# Patient Record
Sex: Male | Born: 1950 | Race: White | Hispanic: No | Marital: Married | Smoking: Never smoker
Health system: Southern US, Community
[De-identification: ages and names within clinical notes are randomized; demographics above are authoritative.]

## PROBLEM LIST (undated history)

## (undated) DIAGNOSIS — G4730 Sleep apnea, unspecified: Secondary | ICD-10-CM

## (undated) DIAGNOSIS — Z8669 Personal history of other diseases of the nervous system and sense organs: Secondary | ICD-10-CM

## (undated) DIAGNOSIS — C679 Malignant neoplasm of bladder, unspecified: Secondary | ICD-10-CM

## (undated) DIAGNOSIS — I2510 Atherosclerotic heart disease of native coronary artery without angina pectoris: Secondary | ICD-10-CM

## (undated) DIAGNOSIS — Z8679 Personal history of other diseases of the circulatory system: Secondary | ICD-10-CM

## (undated) HISTORY — PX: TONSILLECTOMY: SUR1361

## (undated) HISTORY — PX: CARDIAC SURGERY: SHX584

---

## 2012-02-14 DIAGNOSIS — I359 Nonrheumatic aortic valve disorder, unspecified: Secondary | ICD-10-CM | POA: Insufficient documentation

## 2012-11-05 DIAGNOSIS — I480 Paroxysmal atrial fibrillation: Secondary | ICD-10-CM | POA: Insufficient documentation

## 2012-11-05 DIAGNOSIS — Z953 Presence of xenogenic heart valve: Secondary | ICD-10-CM | POA: Insufficient documentation

## 2012-11-06 DIAGNOSIS — I214 Non-ST elevation (NSTEMI) myocardial infarction: Secondary | ICD-10-CM | POA: Insufficient documentation

## 2013-01-16 DIAGNOSIS — I2510 Atherosclerotic heart disease of native coronary artery without angina pectoris: Secondary | ICD-10-CM | POA: Insufficient documentation

## 2013-02-12 DIAGNOSIS — I252 Old myocardial infarction: Secondary | ICD-10-CM | POA: Insufficient documentation

## 2013-02-13 DIAGNOSIS — Z8042 Family history of malignant neoplasm of prostate: Secondary | ICD-10-CM | POA: Insufficient documentation

## 2015-07-07 DIAGNOSIS — K219 Gastro-esophageal reflux disease without esophagitis: Secondary | ICD-10-CM | POA: Insufficient documentation

## 2015-07-08 DIAGNOSIS — I38 Endocarditis, valve unspecified: Secondary | ICD-10-CM | POA: Insufficient documentation

## 2015-07-15 DIAGNOSIS — I340 Nonrheumatic mitral (valve) insufficiency: Secondary | ICD-10-CM | POA: Insufficient documentation

## 2016-04-18 DIAGNOSIS — Z9889 Other specified postprocedural states: Secondary | ICD-10-CM | POA: Insufficient documentation

## 2018-11-20 DIAGNOSIS — Z95818 Presence of other cardiac implants and grafts: Secondary | ICD-10-CM | POA: Insufficient documentation

## 2019-01-16 ENCOUNTER — Ambulatory Visit (INDEPENDENT_AMBULATORY_CARE_PROVIDER_SITE_OTHER): Payer: Medicare Other | Admitting: Sports Medicine

## 2019-01-16 ENCOUNTER — Other Ambulatory Visit: Payer: Self-pay

## 2019-01-16 VITALS — BP 136/72 | Ht 68.0 in | Wt 172.0 lb

## 2019-01-16 DIAGNOSIS — M7671 Peroneal tendinitis, right leg: Secondary | ICD-10-CM

## 2019-01-16 DIAGNOSIS — M25572 Pain in left ankle and joints of left foot: Secondary | ICD-10-CM

## 2019-01-16 DIAGNOSIS — Q667 Congenital pes cavus, unspecified foot: Secondary | ICD-10-CM | POA: Diagnosis not present

## 2019-01-16 NOTE — Assessment & Plan Note (Addendum)
Caused by pes cavus bilaterally causing varus stress with supination of foot, 5th MTP joint floating off ground. Provided sports insole with 5th ray post.  Plan: - sports insole w/ 5th ray post - topical Voltaren PRN - icing daily - return in 1 month for cushioned orthotic

## 2019-01-16 NOTE — Patient Instructions (Addendum)
Icing 15-20 minutes 2-3 times daily  Over-the-counter Voltaren Gel. Use this 4 times daily  Ankle rehab exercises with the theraband  Follow-up one month and we will make you custom orthotics

## 2019-01-16 NOTE — Progress Notes (Signed)
  William Cook - 68 y.o. 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 Reports that it started 9 months ago- switched tennis shoes and started playing on hard courts more rather than clay courts. 5 months ago, he saw sports medicine physician in [place], diagnosed him with peroneal tendonitis, gave him a 2 week course of prednisone and rest. Symptoms improved, but when he started playing tennis more (3-4 x a week), pain returned. It becomes painful enough to the point of limping. Has been icing feet regularly, using ibuprofen. Wears a sleeve most of the time and in addition wears an ankle brace to play tennis. Has rested the past 3 weeks due to pain and generally pain has subsided.   ROS: No unexpected weight loss, fever, chills, swelling, instability, muscle pain, numbness/tingling, redness, otherwise see HPI   PMHx - Updated and reviewed.  Contributory factors include: Negative PSHx - Updated and reviewed.  Contributory factors include:  Negative FHx - Updated and reviewed.  Contributory factors include:  Negative Social Hx - Updated and reviewed. Contributory factors include: Negative Medications - reviewed   DATA REVIEWED: none  PHYSICAL EXAM:  VS: BP:136/72  HR: bpm  TEMP: ( )  RESP:   HT:5\' 8"  (172.7 cm)   WT:172 lb (78 kg)  BMI:26.16 PHYSICAL EXAM: Gen: NAD, alert, cooperative with exam, well-appearing HEENT: clear conjunctiva,  CV:  no edema, capillary refill brisk, normal rate Resp: non-labored Skin: no rashes, normal turgor  Neuro: no gross deficits.  Psych:  alert and oriented  Left Foot: Inspection:  No obvious bony deformity.  No swelling, erythema, or bruising.  Pes cavus bilaterally. 5th toe subluxed with floating 5th MTP. Feet supinate while standing. Palpation: TTP over peroneal tubercle. ROM: Full  ROM of the ankle. Normal midfoot flexibility Strength: 5/5 strength ankle in all  planes Neurovascular: N/V intact distally in the lower extremity Special tests: Negative anterior drawer. Negative squeeze. Normal calcaneal motion with heel raise  Right foot: - pes cavus, 5th toe subluxed, foot supinates while standing  Limited ultrasound, [place] left ankle:  - Peroneus longus and brevis: Evaluated in longitudinal and transverse planes, followed to attachment at base of 5th metatarsal. Evidence of tenosynovitis worse at peroneal tubercle with significant hypoechoic changes.   Impression: peroneal tendinopathy, worse at peroneal tubercle  Ultrasound and interpretation by Dr. Mayer Masker and Wolfgang Phoenix. Fields, MD    ASSESSMENT & PLAN:   Right peroneal tendinopathy Caused by pes cavus bilaterally causing varus stress with supination of foot, 5th MTP joint floating off ground. Provided sports insole with 5th ray post.  Plan: - sports insole w/ 5th ray post - topical Voltaren PRN - icing daily - return in 1 month for cushioned orthotic  I observed and examined the patient Dr. Mayer Masker and agree with assessment and plan.  Note reviewed and modified by me. Ila Mcgill, MD

## 2019-02-13 ENCOUNTER — Ambulatory Visit (INDEPENDENT_AMBULATORY_CARE_PROVIDER_SITE_OTHER): Payer: Medicare Other | Admitting: Sports Medicine

## 2019-02-13 ENCOUNTER — Other Ambulatory Visit: Payer: Self-pay

## 2019-02-13 VITALS — BP 116/80 | Ht 68.0 in | Wt 172.0 lb

## 2019-02-13 DIAGNOSIS — M7671 Peroneal tendinitis, right leg: Secondary | ICD-10-CM | POA: Diagnosis not present

## 2019-02-13 DIAGNOSIS — Q667 Congenital pes cavus, unspecified foot: Secondary | ICD-10-CM

## 2019-02-13 NOTE — Progress Notes (Signed)
  William Cook - 68 y.o. male MRN [id number]  Date of birth: 24-Apr-1951  SUBJECTIVE:   CC: orthotics   William Cook is a 68 yo male returning to clinic today for follow up of right peroneal tendinopathy and to have orthotics made. He reports that his pain has improved significantly since using sports insole with 5th ray post and doing ankle strengthening exercises. He would prefer to continue with sports insole instead of having custom orthotics made as he has been very pleased with the comfort and fit of current insole.  ROS: No unexpected weight loss, fever, chills, swelling, instability, muscle pain, numbness/tingling, redness, otherwise see HPI   PMHx - Updated and reviewed.  Contributory factors include: Negative PSHx - Updated and reviewed.  Contributory factors include:  Negative FHx - Updated and reviewed.  Contributory factors include:  Negative Social Hx - Updated and reviewed. Contributory factors include: Negative Medications - reviewed   DATA REVIEWED: Prior records  PHYSICAL EXAM:  VS: BP:116/80  HR: bpm  TEMP: ( )  RESP:   HT:5\' 8"  (172.7 cm)   WT:172 lb (78 kg)  BMI:26.16 PHYSICAL EXAM: Gen: NAD, alert, cooperative with exam, well-appearing HEENT: clear conjunctiva,  CV:  no edema, capillary refill brisk, normal rate Resp: non-labored Skin: no rashes, normal turgor  Neuro: no gross deficits.  Psych:  alert and oriented  Left foot:  Inspection: pes cavus, 5th toe subluxes Palpation: no TTP ROM: full ROM of ankle Strength: 5/5 strength of ankle in all planes NV intact   ASSESSMENT & PLAN:   Right peroneal tendinopathy- significant improvement with sports insole with lateral post and ankle strengthening exercises. He would prefer to have second green sports insole instead of getting custom orthotics today. Provided second sports insole with lateral post and showed him how to apply the post to the insole. Provided sheet of 1/8th poron blue material and  information for insole so he can purchase these and make them for himself in the future. He knows that he can call and return for custom orthotics if he desires and will return as needed.   Total time spent with the patient was 15 minutes with greater than 50% of the time spent in face-to-face consultation discussing sports insole construction, instruction, and sitting.  Patient seen and evaluated with the sports medicine fellow.  I agree with the above plan of care.  Patient is finding his green sports insoles very comfortable.  He would like to continue with these for the time being.  He understands that we can make him custom orthotics at a later date if he prefers.  Follow-up as needed.

## 2019-03-06 ENCOUNTER — Encounter: Payer: Medicare Other | Admitting: Sports Medicine

## 2019-07-03 DIAGNOSIS — R0602 Shortness of breath: Secondary | ICD-10-CM | POA: Insufficient documentation

## 2019-07-03 DIAGNOSIS — R0981 Nasal congestion: Secondary | ICD-10-CM | POA: Insufficient documentation

## 2019-07-03 DIAGNOSIS — G479 Sleep disorder, unspecified: Secondary | ICD-10-CM | POA: Insufficient documentation

## 2019-07-15 ENCOUNTER — Ambulatory Visit: Payer: Medicare Other | Attending: Neurology

## 2019-07-15 DIAGNOSIS — R0602 Shortness of breath: Secondary | ICD-10-CM | POA: Diagnosis not present

## 2019-07-15 DIAGNOSIS — G4761 Periodic limb movement disorder: Secondary | ICD-10-CM | POA: Insufficient documentation

## 2019-07-15 DIAGNOSIS — G4733 Obstructive sleep apnea (adult) (pediatric): Secondary | ICD-10-CM | POA: Diagnosis not present

## 2019-07-16 ENCOUNTER — Other Ambulatory Visit: Payer: Self-pay

## 2019-08-19 DIAGNOSIS — G4733 Obstructive sleep apnea (adult) (pediatric): Secondary | ICD-10-CM | POA: Insufficient documentation

## 2019-08-21 ENCOUNTER — Ambulatory Visit (INDEPENDENT_AMBULATORY_CARE_PROVIDER_SITE_OTHER): Payer: Medicare Other | Admitting: Pediatrics

## 2019-08-21 ENCOUNTER — Other Ambulatory Visit: Payer: Self-pay

## 2019-08-21 VITALS — BP 120/70 | Ht 68.0 in | Wt 170.0 lb

## 2019-08-21 DIAGNOSIS — M899 Disorder of bone, unspecified: Secondary | ICD-10-CM | POA: Diagnosis present

## 2019-08-21 NOTE — Progress Notes (Signed)
  Ranbir Yohey - 69 y.o. male MRN [id number]  Date of birth: [birth year]  SUBJECTIVE:   CC: left foot pain   Eraclio Lord is a 68 yo male returning to clinic today for pain in left forefoot for the past week. He is an avid Firefighter and has been seen before for peroneal tendonitis, which is improving. He reports that 5 days ago, he was played tennis on hard court for 2 hours. He reports that started to have pain on the ball of his first at his first toe (points to 1st MTP). The pain continued the next few days but with rest it now is better. Pain was worsened when he bent his toe back toward him.   He wears ankle sleeves with tennis and has a green hapad insert with a 5th ray post. He thinks that these have worked well for him. He is reqresting poron material as he cannot find it online.   ROS: No unexpected swelling, instability, muscle pain, numbness/tingling, redness, otherwise see HPI   PMHx - Updated and reviewed.  Contributory factors include: Negative PSHx - Updated and reviewed.  Contributory factors include:  Negative FHx - Updated and reviewed.  Contributory factors include:  Negative Social Hx - Updated and reviewed. Contributory factors include: Negative Medications - reviewed   DATA REVIEWED: Prior records  PHYSICAL EXAM:  VS: BP:120/70  HR: bpm  TEMP: ( )  RESP:   HT:5\' 8"  (172.7 cm)   WT:170 lb (77.1 kg)  BMI:25.85 PHYSICAL EXAM: Gen: NAD, alert, cooperative with exam, well-appearing HEENT: clear conjunctiva,  CV:  no edema, capillary refill brisk, normal rate Resp: non-labored Skin: no rashes, normal turgor  Neuro: no gross deficits.  Psych:  alert and oriented  Left foot:  Inspection: pes cavus, 5th toe subluxes. Palpation: no TTP (prior TTP at 1st MT head) ROM: full ROM of ankle Strength: 5/5 strength of ankle in all planes NV intact   ASSESSMENT & PLAN:  Pain was likely pain over sesamoid bone from impact on hard tennis courts where he is often on  the balls of his feet. Provided dancer's pad to see if this helps and will prevent future pain. Also provided poron material so that he can continue to make 5th ray post on his new green sports inserts. Discussed with him that he is allowed to play but should be aware of pain and avoid pain-eliciting activities as repeated aggravation of sesamoids could lead to sesamoiditis. Return PRN.  I was the preceptor for this visit and available for immediate consultation Shellia Cleverly, DO

## 2019-08-25 ENCOUNTER — Other Ambulatory Visit
Admission: RE | Admit: 2019-08-25 | Discharge: 2019-08-25 | Disposition: A | Discharge status: Home / Self Care | Payer: Medicare Other | Source: Ambulatory Visit | Attending: Acute Care | Admitting: Acute Care

## 2019-08-25 ENCOUNTER — Other Ambulatory Visit: Payer: Self-pay

## 2019-08-25 DIAGNOSIS — Z20822 Contact with and (suspected) exposure to covid-19: Secondary | ICD-10-CM | POA: Diagnosis not present

## 2019-08-25 DIAGNOSIS — Z01812 Encounter for preprocedural laboratory examination: Secondary | ICD-10-CM | POA: Diagnosis present

## 2019-08-26 LAB — SARS CORONAVIRUS 2 (TAT 6-24 HRS): SARS Coronavirus 2: NEGATIVE

## 2019-08-27 ENCOUNTER — Ambulatory Visit: Payer: Medicare Other | Attending: Neurology

## 2019-08-27 DIAGNOSIS — G4761 Periodic limb movement disorder: Secondary | ICD-10-CM | POA: Insufficient documentation

## 2019-08-27 DIAGNOSIS — G4733 Obstructive sleep apnea (adult) (pediatric): Secondary | ICD-10-CM | POA: Diagnosis not present

## 2019-08-28 ENCOUNTER — Other Ambulatory Visit: Payer: Self-pay

## 2019-09-09 DIAGNOSIS — Z4509 Encounter for adjustment and management of other cardiac device: Secondary | ICD-10-CM | POA: Insufficient documentation

## 2019-09-09 DIAGNOSIS — R001 Bradycardia, unspecified: Secondary | ICD-10-CM | POA: Insufficient documentation

## 2019-09-09 DIAGNOSIS — I493 Ventricular premature depolarization: Secondary | ICD-10-CM | POA: Insufficient documentation

## 2020-04-05 ENCOUNTER — Other Ambulatory Visit: Payer: Medicare Other

## 2020-05-30 DIAGNOSIS — R310 Gross hematuria: Secondary | ICD-10-CM | POA: Insufficient documentation

## 2020-06-07 ENCOUNTER — Ambulatory Visit (INDEPENDENT_AMBULATORY_CARE_PROVIDER_SITE_OTHER): Payer: Medicare Other | Admitting: Family Medicine

## 2020-06-07 ENCOUNTER — Other Ambulatory Visit: Payer: Self-pay

## 2020-06-07 ENCOUNTER — Encounter: Payer: Self-pay | Admitting: Family Medicine

## 2020-06-07 VITALS — BP 154/94 | Ht 68.0 in | Wt 180.0 lb

## 2020-06-07 DIAGNOSIS — M5442 Lumbago with sciatica, left side: Secondary | ICD-10-CM

## 2020-06-07 DIAGNOSIS — R269 Unspecified abnormalities of gait and mobility: Secondary | ICD-10-CM | POA: Insufficient documentation

## 2020-06-07 DIAGNOSIS — Q667 Congenital pes cavus, unspecified foot: Secondary | ICD-10-CM | POA: Diagnosis not present

## 2020-06-07 NOTE — Progress Notes (Signed)
PCP: Kayleen Memos, MD  Subjective:   HPI: Patient is a 69 y.o. male here for custom orthotics.  Patient returns for custom orthotics. Sports insoles have helped him tremendously. No pain in feet. Current issue is pain in left leg from low back down posterior leg. Worse after playing tennis. No pain over sesamoids now either.  History reviewed. No pertinent past medical history.  Current Outpatient Medications on File Prior to Visit  Medication Sig Dispense Refill  . acyclovir cream (ZOVIRAX) 5 % Apply topically.    Marland Kitchen amLODipine (NORVASC) 5 MG tablet Take 5 mg by mouth daily.    Marland Kitchen aspirin EC 81 MG tablet Take by mouth.    Marland Kitchen atorvastatin (LIPITOR) 10 MG tablet TAKE 1 TABLET BY MOUTH EVERYDAY AT BEDTIME    . ELIQUIS 5 MG TABS tablet Take 5 mg by mouth 2 (two) times daily.    Marland Kitchen esomeprazole (NEXIUM) 10 MG packet Take 10 mg by mouth daily before breakfast.    . famotidine (PEPCID) 20 MG tablet Take by mouth.    . fluticasone (FLONASE) 50 MCG/ACT nasal spray Place into the nose.    . Multiple Vitamin (MULTIVITAMIN) tablet Take by mouth.    . nitroGLYCERIN (NITROSTAT) 0.4 MG SL tablet Place under the tongue.     No current facility-administered medications on file prior to visit.    History reviewed. No pertinent surgical history.  No Known Allergies  Social History   Socioeconomic History  . Marital status: Married    Spouse name: Not on file  . Number of children: Not on file  . Years of education: Not on file  . Highest education level: Not on file  Occupational History  . Not on file  Tobacco Use  . Smoking status: Not on file  . Smokeless tobacco: Not on file  Substance and Sexual Activity  . Alcohol use: Not on file  . Drug use: Not on file  . Sexual activity: Not on file  Other Topics Concern  . Not on file  Social History Narrative  . Not on file   Social Determinants of Health   Financial Resource Strain: Not on file  Food Insecurity: Not on file   Transportation Needs: Not on file  Physical Activity: Not on file  Stress: Not on file  Social Connections: Not on file  Intimate Partner Violence: Not on file    History reviewed. No pertinent family history.  BP (!) 154/94   Ht 5\' 8"  (1.727 m)   Wt 180 lb (81.6 kg)   BMI 27.37 kg/m   [facility] 06/07/2020  Frequency of aerobic exercise (# of days/week) 5  Average time in minutes 60  Frequency of strengthening activities (# of days/week) 3    No flowsheet data found.  Review of Systems: See HPI above.     Objective:  Physical Exam:  Gen: NAD, comfortable in exam room  Bilateral feet/ankles: Leg lengths equal. Pes cavus.  Transverse arch collapse bilaterally without callus or splaying.  Bunionettes bilaterally.  Mild hallux rigidus bilaterally. No other gross deformity, swelling, ecchymoses FROM with normal strength. No TTP. negative ant drawer and negative talar tilt.   Thompsons test negative. NV intact distally.  Gait: supination noted bilaterally, excellent control with custom orthotics.  Back: No gross deformity, scoliosis. No paraspinal TTP.  No midline or bony TTP. FROM but decreased hamstring and SI joint flexibility. Strength LEs 5/5 all muscle groups.   Negative SLRs. Sensation intact  to light touch bilaterally.  Assessment & Plan:  1. Bilateral foot pain, gait abnormality - custom orthotics made today and comfortable to patient.  Reviewed home exercises for low back, radiculopathy.  Patient was fitted for a : standard, cushioned, semi-rigid orthotic. The orthotic was heated and afterward the patient stood on the orthotic blank positioned on the orthotic stand. The patient was positioned in subtalar neutral position and 10 degrees of ankle dorsiflexion in a weight bearing stance. After completion of molding, a stable base was applied to the orthotic blank. The blank was ground to a stable position for weight  bearing. Size: 10  Base: blue med density eva Posting: lateral posting bilaterally Additional orthotic padding: none

## 2020-06-26 HISTORY — PX: BLADDER SURGERY: SHX569

## 2020-07-02 DIAGNOSIS — Z8679 Personal history of other diseases of the circulatory system: Secondary | ICD-10-CM | POA: Insufficient documentation

## 2020-07-14 ENCOUNTER — Ambulatory Visit: Payer: Medicare Other | Admitting: Family Medicine

## 2020-07-15 ENCOUNTER — Ambulatory Visit (INDEPENDENT_AMBULATORY_CARE_PROVIDER_SITE_OTHER): Payer: Medicare Other | Admitting: Family Medicine

## 2020-07-15 ENCOUNTER — Other Ambulatory Visit: Payer: Self-pay

## 2020-07-15 VITALS — BP 142/84 | Ht 68.0 in | Wt 180.0 lb

## 2020-07-15 DIAGNOSIS — M5416 Radiculopathy, lumbar region: Secondary | ICD-10-CM | POA: Diagnosis not present

## 2020-07-15 NOTE — Progress Notes (Addendum)
   SUBJECTIVE:   CHIEF COMPLAINT / HPI:   William Cook is a 70 y.o. male here for lower extremity pain.  Left leg pain Patient reports left leg pain for the past 3-4 months.  Pain radiates to his foot and occasionally his toes.  Has chronic left-sided back pain as well.  Pain described as burning and shooting.  He plays tennis 3-4 times a week.  About 1 to 2 weeks ago he was playing tennis and the pain occurred during his match where as it normally occurs after his match. He has not played in the past week due to his pain in the weather.  No specific activity makes pain better or worse. Patient has history of right peroneal tendinopathy and states insoles have helped a lot.     PERTINENT  PMH / PSH: reviewed and updated as appropriate   OBJECTIVE:   BP (!) 142/84   Ht 5\' 8"  (1.727 m)   Wt 180 lb (81.6 kg)   BMI 27.37 kg/m    GEN: well appearing male in no acute distress  CVS: well perfused  RESP: speaking in full sentences without pause, no respiratory distress  MSK:  Ankle, knee and hip strength 5/5.  Negative straight leg raise. Mild tenderness over the piriformis muscle, ischial tuberosity.  No SI joint tenderness.  No midline T or L-spine tenderness.  Left paraspinal hypertonicity present.  ASSESSMENT/PLAN:   Patient is a 70 year old male with chronic low back and extremity pain  Lumbar radiculopathy Discussed treatment options with patient.  No imaging available for review.  Start physical therapy and avoid offending activities.  Follow-up, if not improving.   Lyndee Hensen, DO PGY-2, [facility] 07/15/2020    I was the preceptor for this visit and available for immediate consultation Shellia Cleverly, DO

## 2020-08-17 DIAGNOSIS — C679 Malignant neoplasm of bladder, unspecified: Secondary | ICD-10-CM | POA: Insufficient documentation

## 2020-08-26 ENCOUNTER — Other Ambulatory Visit: Payer: Self-pay

## 2020-08-26 ENCOUNTER — Ambulatory Visit (INDEPENDENT_AMBULATORY_CARE_PROVIDER_SITE_OTHER): Payer: Medicare Other | Admitting: Family Medicine

## 2020-08-26 VITALS — BP 142/82 | Ht 68.0 in | Wt 175.0 lb

## 2020-08-26 DIAGNOSIS — M5416 Radiculopathy, lumbar region: Secondary | ICD-10-CM

## 2020-08-26 NOTE — Progress Notes (Signed)
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  At her last appointment, patient was given home exercises as well as a formal physical therapy referral.  He states that he started to feel significantly better with doing the exercises and after meeting with PT. unfortunately, patient then had an unrelated medical setback requiring surgical intervention.  He is approximately 1 week out from his surgery, and says that his postop phase has prevented him from doing the previously prescribed rehabilitation exercises.  Since stopping these exercises, his pain has unfortunately returned.  He also thinks that this could be due to the fact that he has been doing a lot of math teaching over Zoom, which requires several long hours daily of sitting hunched in front of a computer monitor.  He confesses that he has poor posture with this prolonged sitting, and his back pain is most significantly notable upon initial standing after teaching.  Pain continues to radiate all the way into his left foot, which will occasionally have numbness during his more severe bouts.  No bowel or bladder dysfunction.  He is optimistic that when he is cleared by his surgeon he can return to his exercises and hopefully resume the progress that he had been making. Currently, he says today has been a very good day for his back and he has no active symptoms.              ROS:   All other systems were reviewed and are negative.  Objective: Vital Signs: BP (!) 142/82   Ht 5\' 8"  (1.727 m)   Wt 175 lb (79.4 kg)   BMI 26.61 kg/m   Physical Exam:  General:  Alert and oriented, in no acute  distress. Pulm:  Breathing unlabored. Psy:  Normal mood, congruent affect. Skin: Lower back with no bruising, rashes, or erythema. BACK EXAMINATION: Normal Gait.  Normal Spinal curvature, without excessive lumbar lordosis, thoracic kyphosis, or scoliosis.  Palpation: No midline tenderness, no deformity or step-offs. No paraspinal muscle tenderness, and no tenderness over the SI Joints bilaterally. Gluteus musculature and piriformis without tender points. Negative Sacral Spring's test.  Strength: Hip flexion (L1), Hip Aduction (L2), Knee Extension (L3) are 5/5 Bilaterally Foot Inversion (L4), Dorsiflexion (L5), and Eversion (S1) 5/5 Bilaterally Sensation: Intact to light touch medial and lateral aspects of lower extremities, and lateral, dorsal, and medial aspects of foot.  Special Tests:  FABER negative SLR: No radiation down Ipilateral or contralateral leg bilaterally   Imaging: No results found.  Assessment & Plan: 70 year old male presenting to clinic with concerns of left-sided lumbar radiculopathy.  Patient had been making good strides in improvement with exercise therapy, however he had to stop this due to a medical emergency. -At this time, patient says he is optimistic about returning to exercises for ongoing improvement. -Discussed the possibility of adding gabapentin at night during the postoperative phase, but patient declines at this time.  He says he would like to try to adjust the ergonomics of his sitting and ease back into the exercises before adding oral medications. -Discussed that our neck steps in  care should he not notice any benefit with exercises or possible eventual addition of gabapentin, would be MRI and consideration of spinal injections. -Patient is agreeable with plan.  We discussed sitting ergonomics at length, including taking standing breaks or transitioning to a standing desk.  He is optimistic about these changes. -Patient no further questions or concerns at  the completion of today's visit.  He will return to clinic in approximately 4 to 6 weeks for reevaluation.     Procedures: No procedures performed      I was the preceptor for this visit and available for immediate consultation Shellia Cleverly, DO

## 2020-09-01 ENCOUNTER — Encounter: Payer: Self-pay | Admitting: Urology

## 2020-09-01 ENCOUNTER — Ambulatory Visit (INDEPENDENT_AMBULATORY_CARE_PROVIDER_SITE_OTHER): Payer: Medicare Other | Admitting: Urology

## 2020-09-01 ENCOUNTER — Other Ambulatory Visit: Payer: Self-pay

## 2020-09-01 VITALS — BP 122/74 | HR 58 | Ht 68.0 in | Wt 175.0 lb

## 2020-09-01 DIAGNOSIS — R319 Hematuria, unspecified: Secondary | ICD-10-CM | POA: Diagnosis not present

## 2020-09-01 DIAGNOSIS — C672 Malignant neoplasm of lateral wall of bladder: Secondary | ICD-10-CM

## 2020-09-01 NOTE — Progress Notes (Signed)
   09/01/20 12:29 PM   William Cook 1951/02/13 [id number]  CC: Bladder cancer  HPI: I saw William Cook for second opinion on bladder cancer.  He is a 70 year old male with porcine heart valve and history of atrial fibrillation who remains on Eliquis who was recently diagnosed with bladder cancer.  He is a never smoker.  A renal/bladder ultrasound was performed for gross hematuria and showed a 2 cm lateral wall bladder tumor, and he underwent a TURBT on 08/17/2020 with Dr. Rosana Hoes at [facility].  This was complicated by hematuria and clot retention requiring an ER visit for catheter irrigation.  His catheter has been out for over a week now, and he is having some urinary frequency, but otherwise doing well.  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  He is interested in discussing possible BCG, as he was offered gemcitabine at [facility].  Urinalysis today with 11-30 WBCs, 11-30 RBCs, no bacteria, nitrite negative, 1+ leukocytes.  Will send for culture  Social History:  reports that he has never smoked. He has never used smokeless tobacco. He reports current alcohol use. He reports that he does not use drugs.  Physical Exam: BP 122/74   Pulse (!) 58   Ht 5\' 8"  (1.727 m)   Wt 175 lb (79.4 kg)   BMI 26.61 kg/m    Constitutional:  Alert and oriented, No acute distress. Cardiovascular: No clubbing, cyanosis, or edema. Respiratory: Normal respiratory effort, no increased work of breathing. GI: Abdomen is soft, nontender, nondistended, no abdominal masses  Laboratory Data: Reviewed, see HPI  Assessment & Plan:   71 year old never smoker with recent diagnosis of HG Ta 2 cm bladder tumor status post TURBT on 08/17/2020.  Muscle was present in the specimen and not involved.  We reviewed the AUA guidelines regarding nonmuscle invasive bladder cancer including consideration of repeat  TURBT, and intravesical treatment with BCG or gemcitabine.  We discussed the risk and benefits at length, and data showing decreased risk of recurrence and progression with BCG.  I recommended proceeding with induction BCG, followed by 1 year total of maintenance BCG, and close surveillance cystoscopy moving forward.  Schedule induction BCG and ongoing cystoscopy surveillance per guideline recommendations Call with urine culture results  Nickolas Madrid, MD 09/01/2020  [facility] [address] [place], [place] [address] [phone]

## 2020-09-01 NOTE — Patient Instructions (Signed)

## 2020-09-03 LAB — URINALYSIS, COMPLETE
Bilirubin, UA: NEGATIVE
Glucose, UA: NEGATIVE
Ketones, UA: NEGATIVE
Nitrite, UA: NEGATIVE
Protein,UA: NEGATIVE
Specific Gravity, UA: 1.015 (ref 1.005–1.030)
Urobilinogen, Ur: 0.2 mg/dL (ref 0.2–1.0)
pH, UA: 6 (ref 5.0–7.5)

## 2020-09-03 LAB — MICROSCOPIC EXAMINATION
Bacteria, UA: NONE SEEN
Epithelial Cells (non renal): NONE SEEN /hpf (ref 0–10)

## 2020-09-06 ENCOUNTER — Telehealth: Payer: Self-pay

## 2020-09-06 NOTE — Telephone Encounter (Signed)
Address via telephone note

## 2020-09-06 NOTE — Telephone Encounter (Signed)
Spoke with patient and discussed BCG treatment and instructions in detail. Appointments were made and instructions were sent via mychart to review. Patient's questions were answered and patient verbalized understanding

## 2020-09-08 NOTE — Telephone Encounter (Signed)
Unlikely to be a reaction between the two, but just to be safe would recommend waiting for any booster until after the 6 BCG sessions  Nickolas Madrid, MD 09/08/2020

## 2020-09-24 ENCOUNTER — Other Ambulatory Visit: Payer: Self-pay

## 2020-09-24 ENCOUNTER — Ambulatory Visit (INDEPENDENT_AMBULATORY_CARE_PROVIDER_SITE_OTHER): Payer: Medicare Other | Admitting: Physician Assistant

## 2020-09-24 DIAGNOSIS — C672 Malignant neoplasm of lateral wall of bladder: Secondary | ICD-10-CM | POA: Diagnosis not present

## 2020-09-24 LAB — URINALYSIS, COMPLETE
Bilirubin, UA: NEGATIVE
Glucose, UA: NEGATIVE
Nitrite, UA: NEGATIVE
Protein,UA: NEGATIVE
Specific Gravity, UA: 1.015 (ref 1.005–1.030)
Urobilinogen, Ur: 0.2 mg/dL (ref 0.2–1.0)
pH, UA: 5.5 (ref 5.0–7.5)

## 2020-09-24 LAB — MICROSCOPIC EXAMINATION: Bacteria, UA: NONE SEEN

## 2020-09-24 MED ORDER — BCG LIVE 50 MG IS SUSR
3.2400 mL | Freq: Once | INTRAVESICAL | Status: AC
  Administered 2020-09-24: 81 mg via INTRAVESICAL

## 2020-09-24 NOTE — Progress Notes (Signed)
BCG Bladder Instillation  BCG # 1 of 6  Due to Bladder Cancer patient is present today for a BCG treatment. Patient was cleaned and prepped in a sterile fashion with betadine. A 14FR catheter was inserted, urine return was noted 21ml, urine was yellow in color.  1ml of reconstituted BCG was instilled into the bladder. The catheter was then removed. Patient tolerated well, no complications were noted.  Performed by: Debroah Loop, PA-C and Bradly Bienenstock, CMA  Follow up/ Additional notes: Patient remained in clinic for 30 minutes following instillation today for monitoring of hypersensitivity reaction; none noted.  We reviewed post-instillation instructions including holding the urine for 2 hours with quarter turns every 15 minutes and pouring bleach into the toilet with subsequent voids for 6 hours. Written instructions also provided today. He expressed understanding.

## 2020-09-24 NOTE — Patient Instructions (Signed)
Right now through 1:15pm: Hold your urine and do your quarter turns every 15 minutes. 1:15pm-7:15pm today: Every time you urinate, pour 1/2 cup of bleach into the toilet and let it sit for 15 minutes prior to flushing. 7:15pm onward: Resume your normal routine.  Patient Education: (BCG) Into the Bladder (Intravesical Chemotherapy)  BCG is a vaccine which is used to prevent tuberculosis (TB).  But it's also a helpful treatment for some early bladder cancers.  When BCG goes directly into the bladder the treatment is described as intravesical.  BCG is a type of immunotherapy.  Immunotherapy stimulates the body's immune system to destroy cancer cells.  How it's given BCG treatment is given to you in an outpatient setting.  It takes a few minutes to administer and you can go home as soon as it's finished.  It might be a good idea to ask someone to bring you, particularly the fist time.  Unlike chemotherapy into the bladder, BCG treatment is never given immediately after surgery to remove bladder tumors.  There needs to be a delay usually of at least two weeks after surgery, before you can have it.  You won't be given treatment with BCG if you are unwell or have an infection in your urine.  You're usually asked to limit the amount you drink before your treatment.  This will help to increase the concentration of BCG in your bladder.  Drinking too much before your treatment may make your bladder feel uncomfortably full.  If you normally take water tablets (diuretics) take them later in the day after your treatment.  Your nurse or doctor will give you more advise about preparing for your treatment.  You will have a small tube (catheter) placed into your bladder.  Your doctor will then put the liquid vaccine directly into your bladder through the catheter and remove the catheter.  You will need to hold your urine for two hours afterwards.  This can be difficult but it's to give the treatment time to work.   You can walk around during this time.  When the treatment is over you can go to the toilet.  After your treatment there are some precautions you'll need to take.  This is because BCG is a live vaccine and other people shouldn't be exposed to it.  For the next six hours, you'll need to avoid your urine splashing on the toilet seat and getting any urine on your hands.  It might be easer for men to sit down when they're using an ordinary toilet although using a stand up urinal should be alright.  The main this is to avoid splashing urine and spreading the vaccine.  You will also be asked to put 1/2 cup undiluted bleach into the toilet to destroy any live vaccine and leave it for 15 minutes until you flush.  Side Effects Because BCG goes directly into the bladder most of the side effects are linked with the bladder.  They usually go away within one to two days after your treatment.  The most common ones are: -needing to pass urine often -pain when you pass urine -blood in urine -flu-like symptoms (tiredness, general aching and raised temperature)  Theses side effects should settle down within a day or two.  If they don't get better contact your doctor.  Drinking lots of fluids can help flush the drug out of your bladder and reduce some of these effects.  Taking Ibuprofen or Aleve is encouraged unless you have a condition  that would make these medications unsafe to take (renal failure, diabetes, gerd)  Rare side effects can include a continuing high temperature (fever), pain in your joints and a cough.  If you have any of these symptoms, or if you feel generally unwell, contact your doctor.  These symptoms could be a sign of a more serious infection (due to BCG) that needs to be treated immediately.  If this happens you'll be treated with the same drugs (antibiotics) that are used to treat TB.  Contraception Men should use a condom during sex for the first 48 hours after their treatment.  If you are a  women who has had BCG treatment then your partner should use a condom.  Using a condom will protect your partner from any vaccine present in your semen or vaginal fluid.  We don't know how BCG may affect a developing fetus so it's not advisable to become pregnant or father a child while having it.  It is important to use effective contraception during your treatment and for six weeks afterwards.  You can discuss this with your doctor or specialist nurse.

## 2020-10-01 ENCOUNTER — Other Ambulatory Visit: Payer: Self-pay

## 2020-10-01 ENCOUNTER — Ambulatory Visit (INDEPENDENT_AMBULATORY_CARE_PROVIDER_SITE_OTHER): Payer: Medicare Other | Admitting: Physician Assistant

## 2020-10-01 DIAGNOSIS — C672 Malignant neoplasm of lateral wall of bladder: Secondary | ICD-10-CM | POA: Diagnosis not present

## 2020-10-01 LAB — URINALYSIS, COMPLETE
Bilirubin, UA: NEGATIVE
Glucose, UA: NEGATIVE
Leukocytes,UA: NEGATIVE
Nitrite, UA: NEGATIVE
Protein,UA: NEGATIVE
Specific Gravity, UA: 1.025 (ref 1.005–1.030)
Urobilinogen, Ur: 0.2 mg/dL (ref 0.2–1.0)
pH, UA: 6 (ref 5.0–7.5)

## 2020-10-01 LAB — MICROSCOPIC EXAMINATION: Bacteria, UA: NONE SEEN

## 2020-10-01 MED ORDER — BCG LIVE 50 MG IS SUSR
3.2400 mL | Freq: Once | INTRAVESICAL | Status: AC
  Administered 2020-10-01: 81 mg via INTRAVESICAL

## 2020-10-01 NOTE — Progress Notes (Signed)
BCG Bladder Instillation  BCG # 2 of 6  Due to Bladder Cancer patient is present today for a BCG treatment. Patient was cleaned and prepped in a sterile fashion with betadine. A 14FR catheter was inserted, urine return was noted 20ml, urine was yellow in color.  50ml of reconstituted BCG was instilled into the bladder. The catheter was then removed. Patient tolerated well, no complications were noted  Performed by: Keldric Poyer, PA-C and Crysta Albright, CMA  Follow up/ Additional notes: 1 week for BCG #3 of 6   

## 2020-10-07 ENCOUNTER — Other Ambulatory Visit: Payer: Self-pay

## 2020-10-07 ENCOUNTER — Ambulatory Visit (INDEPENDENT_AMBULATORY_CARE_PROVIDER_SITE_OTHER): Payer: Medicare Other | Admitting: Physician Assistant

## 2020-10-07 DIAGNOSIS — C672 Malignant neoplasm of lateral wall of bladder: Secondary | ICD-10-CM | POA: Diagnosis not present

## 2020-10-07 MED ORDER — BCG LIVE 50 MG IS SUSR
3.2400 mL | Freq: Once | INTRAVESICAL | Status: AC
  Administered 2020-10-07: 81 mg via INTRAVESICAL

## 2020-10-07 NOTE — Progress Notes (Signed)
BCG Bladder Instillation  BCG # 3 of 6  Due to Bladder Cancer patient is present today for a BCG treatment. Patient was cleaned and prepped in a sterile fashion with betadine. A 14FR catheter was inserted, urine return was noted 56ml, urine was yellow in color.  23ml of reconstituted BCG was instilled into the bladder. The catheter was then removed. Patient tolerated well, no complications were noted  Performed by: Debroah Loop, PA-C and Bradly Bienenstock, CMA  Follow up/ Additional notes: 1 week for BCG #4 of 6

## 2020-10-11 LAB — URINALYSIS, COMPLETE
Bilirubin, UA: NEGATIVE
Glucose, UA: NEGATIVE
Ketones, UA: NEGATIVE
Leukocytes,UA: NEGATIVE
Nitrite, UA: NEGATIVE
Protein,UA: NEGATIVE
Specific Gravity, UA: 1.015 (ref 1.005–1.030)
Urobilinogen, Ur: 0.2 mg/dL (ref 0.2–1.0)
pH, UA: 6 (ref 5.0–7.5)

## 2020-10-11 LAB — MICROSCOPIC EXAMINATION: Bacteria, UA: NONE SEEN

## 2020-10-15 ENCOUNTER — Encounter: Payer: Self-pay | Admitting: Physician Assistant

## 2020-10-15 ENCOUNTER — Other Ambulatory Visit: Payer: Self-pay

## 2020-10-15 ENCOUNTER — Ambulatory Visit (INDEPENDENT_AMBULATORY_CARE_PROVIDER_SITE_OTHER): Payer: Medicare Other | Admitting: Physician Assistant

## 2020-10-15 DIAGNOSIS — C672 Malignant neoplasm of lateral wall of bladder: Secondary | ICD-10-CM

## 2020-10-15 LAB — URINALYSIS, COMPLETE
Bilirubin, UA: NEGATIVE
Glucose, UA: NEGATIVE
Leukocytes,UA: NEGATIVE
Nitrite, UA: NEGATIVE
Protein,UA: NEGATIVE
RBC, UA: NEGATIVE
Specific Gravity, UA: 1.015 (ref 1.005–1.030)
Urobilinogen, Ur: 0.2 mg/dL (ref 0.2–1.0)
pH, UA: 6 (ref 5.0–7.5)

## 2020-10-15 LAB — MICROSCOPIC EXAMINATION
Bacteria, UA: NONE SEEN
Epithelial Cells (non renal): NONE SEEN /hpf (ref 0–10)
RBC, Urine: NONE SEEN /hpf (ref 0–2)

## 2020-10-15 MED ORDER — BCG LIVE 50 MG IS SUSR
3.2400 mL | Freq: Once | INTRAVESICAL | Status: AC
  Administered 2020-10-15: 81 mg via INTRAVESICAL

## 2020-10-15 NOTE — Progress Notes (Signed)
BCG Bladder Instillation  BCG # 4 of 6  Due to Bladder Cancer patient is present today for a BCG treatment. Patient was cleaned and prepped in a sterile fashion with betadine. A 14FR catheter was inserted, urine return was noted 38ml, urine was yellow in color.  46ml of reconstituted BCG was instilled into the bladder. The catheter was then removed. Patient tolerated well, no complications were noted  Performed by: Debroah Loop, PA-C and [place], CMA  Follow up/ Additional notes: 1 week for BCG #5 of 6

## 2020-10-22 ENCOUNTER — Other Ambulatory Visit: Payer: Self-pay

## 2020-10-22 ENCOUNTER — Ambulatory Visit (INDEPENDENT_AMBULATORY_CARE_PROVIDER_SITE_OTHER): Payer: Medicare Other | Admitting: Physician Assistant

## 2020-10-22 DIAGNOSIS — C672 Malignant neoplasm of lateral wall of bladder: Secondary | ICD-10-CM

## 2020-10-22 LAB — URINALYSIS, COMPLETE
Bilirubin, UA: NEGATIVE
Glucose, UA: NEGATIVE
Ketones, UA: NEGATIVE
Leukocytes,UA: NEGATIVE
Nitrite, UA: NEGATIVE
Protein,UA: NEGATIVE
RBC, UA: NEGATIVE
Specific Gravity, UA: 1.015 (ref 1.005–1.030)
Urobilinogen, Ur: 0.2 mg/dL (ref 0.2–1.0)
pH, UA: 6.5 (ref 5.0–7.5)

## 2020-10-22 LAB — MICROSCOPIC EXAMINATION
Bacteria, UA: NONE SEEN
Epithelial Cells (non renal): NONE SEEN /hpf (ref 0–10)
RBC, Urine: NONE SEEN /hpf (ref 0–2)
WBC, UA: NONE SEEN /hpf (ref 0–5)

## 2020-10-22 MED ORDER — BCG LIVE 50 MG IS SUSR
3.2400 mL | Freq: Once | INTRAVESICAL | Status: AC
  Administered 2020-10-22: 81 mg via INTRAVESICAL

## 2020-10-22 NOTE — Progress Notes (Signed)
BCG Bladder Instillation  BCG # 5 of 6  Due to Bladder Cancer patient is present today for a BCG treatment. Patient was cleaned and prepped in a sterile fashion with betadine. A 14FR catheter was inserted, urine return was noted 57ml, urine was yellow in color.  73ml of reconstituted BCG was instilled into the bladder. The catheter was then removed. Patient tolerated well, no complications were noted  Performed by: Debroah Loop, PA-C and Bradly Bienenstock, CMA  Follow up/ Additional notes: 1 week for BCG #6 of 6

## 2020-10-28 ENCOUNTER — Ambulatory Visit: Payer: Medicare Other | Admitting: Family Medicine

## 2020-10-28 ENCOUNTER — Ambulatory Visit (INDEPENDENT_AMBULATORY_CARE_PROVIDER_SITE_OTHER): Payer: Medicare Other | Admitting: Physician Assistant

## 2020-10-28 ENCOUNTER — Other Ambulatory Visit: Payer: Self-pay

## 2020-10-28 DIAGNOSIS — C672 Malignant neoplasm of lateral wall of bladder: Secondary | ICD-10-CM | POA: Diagnosis not present

## 2020-10-28 MED ORDER — BCG LIVE 50 MG IS SUSR
3.2400 mL | Freq: Once | INTRAVESICAL | Status: AC
  Administered 2020-10-28: 81 mg via INTRAVESICAL

## 2020-10-28 NOTE — Progress Notes (Signed)
BCG Bladder Instillation  BCG # 6 of 6  Due to Bladder Cancer patient is present today for a BCG treatment. Patient was cleaned and prepped in a sterile fashion with betadine. A 14FR catheter was inserted, urine return was noted 8ml, urine was yellow in color.  71ml of reconstituted BCG was instilled into the bladder. The catheter was then removed. Patient tolerated well, no complications were noted  Performed by: Debroah Loop, PA-C and Bradly Bienenstock, CMA  Follow up/ Additional notes: 3 month cystoscopy

## 2020-10-29 LAB — MICROSCOPIC EXAMINATION: Bacteria, UA: NONE SEEN

## 2020-10-29 LAB — URINALYSIS, COMPLETE
Bilirubin, UA: NEGATIVE
Glucose, UA: NEGATIVE
Leukocytes,UA: NEGATIVE
Nitrite, UA: NEGATIVE
Protein,UA: NEGATIVE
Specific Gravity, UA: 1.025 (ref 1.005–1.030)
Urobilinogen, Ur: 0.2 mg/dL (ref 0.2–1.0)
pH, UA: 5.5 (ref 5.0–7.5)

## 2020-11-04 ENCOUNTER — Other Ambulatory Visit: Payer: Self-pay

## 2020-11-04 ENCOUNTER — Ambulatory Visit (INDEPENDENT_AMBULATORY_CARE_PROVIDER_SITE_OTHER): Payer: Medicare Other | Admitting: Family Medicine

## 2020-11-04 VITALS — BP 122/72 | Ht 68.0 in | Wt 175.0 lb

## 2020-11-04 DIAGNOSIS — M7672 Peroneal tendinitis, left leg: Secondary | ICD-10-CM | POA: Diagnosis not present

## 2020-11-04 DIAGNOSIS — M5416 Radiculopathy, lumbar region: Secondary | ICD-10-CM | POA: Diagnosis not present

## 2020-11-04 NOTE — Progress Notes (Signed)
Office Visit Note   Patient: William Cook           Date of Birth: [birth year]           MRN: [id number] Visit Date: 11/04/2020 Requested by: Kayleen Memos, MD No address on file PCP: Kayleen Memos, MD  Subjective: CC: Follow-up, left ankle and left lower back pain.  HPI: 70 year old male who is presenting to clinic today to follow-up chronic left-sided lower back pain and left ankle pain.  Patient states that he feels as though he has plateaued in his recovery, despite diligent adherence to his lower back exercises.  Previously, he was given custom orthotics to help with peroneal tendinitis, and he states that this has improved his symptoms somewhat but he is curious if his residual ankle symptoms are causing his back to flare.  He says that he has noticed his back will hurt consistently after his ankle started to ache.  He notices his ankle will hurt after long walks, vigorous tennis games, or mowing the lawn.  He has never done dedicated rehabilitation for his ankle, and he is curious to know if there are exercises available.   He states he wears his previously made custom orthotics daily, and has them with him today. Since her last appointment, he has transition to a standing desk-which she feels has helped his back.  He has returned to tennis, but says that his left-sided discomfort means he swings harder with his arm, and is started to experience right lateral epicondylitis.  He says he has already looked up the exercises for this and does not need further guidance.              ROS:   All other systems were reviewed and are negative.  Objective: Vital Signs: BP 122/72   Ht 5\' 8"  (1.727 m)   Wt 175 lb (79.4 kg)   BMI 26.61 kg/m  [facility] Adult Exercise 06/07/2020 07/15/2020 11/04/2020  Frequency of aerobic exercise (# of days/week) 5 5 5   Average time in minutes 60 60 45  Frequency of strengthening activities (# of days/week) 3 2 3      No flowsheet data  found.  Physical Exam:  General:  Alert and oriented, in no acute distress. Pulm:  Breathing unlabored. Psy:  Normal mood, congruent affect. Skin: Lower leg without bruises, rashes, or erythema. Overlying skin intact.  Normal gait. Left ankle with full range of motion without pain. There is mild tenderness to palpation over the peroneal tendons as they course behind and beneath the lateral malleolus.  No significant tenderness along the fifth metatarsal.  No Achilles tenderness or tenderness within the midfoot. 5 out of 5 strength with plantarflexion, dorsiflexion, inversion and eversion of the ankle. Sensation intact, with brisk capillary refill.  Imaging: None today.  Assessment & Plan: 70 year old male presenting to clinic to follow-up on left-sided lower back pain with lumbar radiculopathy, as well as left chronic ankle peroneal tendinitis.  Patient suspected that his chronic ankle symptoms are localizing his gait to change which flares his lower back.  At this time, it seems reasonable to add additional rehabilitation exercises to focus on his peroneal tendons.  If he notices that this improves his symptoms, continue these as needed.  Otherwise, return to clinic in approximately 4 weeks for consideration of peritoneal tendon injection to help further improve his symptoms. -If his back does not improve with focus on peroneal tendons, could consider ESI injection in the future. -Patient  is optimistic about recovery with focus on exercises. -Voltaren gel sparingly as needed on his peroneal tendons.  He is on oral Eliquis, thus cannot take systemic NSAIDs. -Patient had no further questions or concerns today.   I was the preceptor for this visit and available for immediate consultation Shellia Cleverly, DO

## 2020-11-25 ENCOUNTER — Ambulatory Visit: Payer: Medicare Other | Admitting: Family Medicine

## 2020-11-29 NOTE — Telephone Encounter (Signed)
Guideline is actually 3-4 months so hes right in that window. BCG can cause inflammation and we cannot differentiate that from early tumor so need to wait that amount of time. Timing looks perfect on my end  Nickolas Madrid, MD 11/29/2020

## 2021-01-20 ENCOUNTER — Other Ambulatory Visit: Payer: Self-pay

## 2021-01-20 ENCOUNTER — Ambulatory Visit
Admission: EM | Admit: 2021-01-20 | Discharge: 2021-01-20 | Disposition: A | Discharge status: Home / Self Care | Payer: Medicare Other

## 2021-01-20 ENCOUNTER — Encounter: Payer: Self-pay | Admitting: Emergency Medicine

## 2021-01-20 DIAGNOSIS — H6121 Impacted cerumen, right ear: Secondary | ICD-10-CM | POA: Diagnosis not present

## 2021-01-20 HISTORY — DX: Atherosclerotic heart disease of native coronary artery without angina pectoris: I25.10

## 2021-01-20 NOTE — ED Triage Notes (Signed)
Pt here with right ear pain since yesterday. No other sx. Happens often with his allergies.

## 2021-01-20 NOTE — Discharge Instructions (Addendum)
You may use Debrox drops once weekly to help with cerumen buildup. You may apply mineral oil soaked cotton balls into both ears once weekly to help with cerumen buildup. Do not use Q-Tips as this may make impactions worse. Keep ears clean and dry. If you have worsening of symptoms return to clinic for reevaluation.

## 2021-01-20 NOTE — ED Provider Notes (Addendum)
Chief Complaint   Chief Complaint  Patient presents with   Otalgia     Subjective, HPI  William Cook is a very pleasant 70 y.o. male who presents with right ear pain onset yesterday.  Patient states that sometimes this happens with allergy season.  Patient also reports some congestion.  Patient states that he has been given Cortisporin in the past.  Patient states he does have a history of a heart valve replacement so he is always very concerned whenever he has discomfort for a bacterial infection.  States that he did instill vinegar and alcohol into the ear without relief.  He does not report any fever, chills, vomiting.  Patient's problem list, past medical and social history, medications, and allergies were reviewed by me and updated in Epic.   ROS  See HPI.  Objective   Vitals:   01/20/21 1630  BP: (!) 161/91  Pulse: (!) 52  Resp: 18  Temp: 98.9 F (37.2 C)  SpO2: 99%     General: Appears well-developed and well-nourished. No acute distress.  HEENT Head: Normocephalic and atraumatic. Eyes: Conjunctivae and EOM are normal. No eye drainage or scleral icterus bilaterally.  Ears: Bilateral: Hearing grossly intact. No drainage or visible deformity. No mastoid erythema, edema, or tenderness.  Right: Cerumen impaction. Left: WNL Neck: Normal range of motion, neck is supple.  Cardiovascular: Normal rate and regular rhythm; no gallops, or rubs.  Patient does have a heart murmur for which he is aware. Pulm/Chest: No respiratory distress. Breath sounds normal bilaterally without wheezes, rhonchi, or rales. No accessory muscle usage, speaking in full sentences.  Skin: Skin is warm and dry.    Vital signs and nursing note reviewed.    Assessment & Plan  1. Impacted cerumen of right ear  No orders of the defined types were placed in this encounter.   70 y.o. male presents with right ear pain onset yesterday.  Patient states that sometimes this happens with allergy season.   Patient also reports some congestion.  Patient states that he has been given Cortisporin in the past.  Patient states he does have a history of a heart valve replacement so he is always very concerned whenever he has discomfort for a bacterial infection.  States that he did instill vinegar and alcohol into the ear without relief.  He does not report any fever, chills, vomiting.  Chart review completed.  Cerumen impaction cleared to the right ear with lavage.  There is no evidence of infection to the canal or to the TM.  Advised about home treatment and care along with strict return precautions as outlined in his AVS.  Return as needed.  Stable on discharge.  Plan:   Discharge Instructions      You may use Debrox drops once weekly to help with cerumen buildup. You may apply mineral oil soaked cotton balls into both ears once weekly to help with cerumen buildup. Do not use Q-Tips as this may make impactions worse. Keep ears clean and dry. If you have worsening of symptoms return to clinic for reevaluation.         Serafina Royals, FNP-C 01/20/21  This note was partially made with the aid of speech-to-text dictation; typographical errors are not intentional.    Serafina Royals, [place] 01/20/21 [place], [place], [place] 02/02/21 [phone]

## 2021-01-21 ENCOUNTER — Encounter: Payer: Self-pay | Admitting: Emergency Medicine

## 2021-01-21 ENCOUNTER — Ambulatory Visit
Admission: EM | Admit: 2021-01-21 | Discharge: 2021-01-21 | Disposition: A | Discharge status: Home / Self Care | Payer: Medicare Other | Attending: Emergency Medicine | Admitting: Emergency Medicine

## 2021-01-21 DIAGNOSIS — H6691 Otitis media, unspecified, right ear: Secondary | ICD-10-CM

## 2021-01-21 HISTORY — DX: Personal history of other diseases of the nervous system and sense organs: Z86.69

## 2021-01-21 MED ORDER — NEOMYCIN-POLYMYXIN-HC 3.5-10000-1 OT SUSP: 4.0000 [drp] | Freq: Three times a day (TID) | OTIC | 0 refills | Status: DC

## 2021-01-21 MED ORDER — AMOXICILLIN 875 MG PO TABS: 875.0000 mg | ORAL_TABLET | Freq: Two times a day (BID) | ORAL | 0 refills | Status: DC

## 2021-01-21 NOTE — ED Triage Notes (Signed)
Patient c/o RT ear problem x 2 days.   Patient endorses increased pain and ear fullness.   Patient endorses RT sided face and neck pain.   Patient was seen at this clinic yesterday and diagnosed with Cerumen Impaction and ear irrigation was performed per patient statement.   Patient has taken Tylenol with no relief of symptoms.

## 2021-01-21 NOTE — ED Provider Notes (Signed)
William Cook    CSN: [id number] Arrival date & time: 01/21/21  1052      History   Chief Complaint Chief Complaint  Patient presents with   Ear Problem    HPI William Cook is a 70 y.o. male.  Patient presents with 2-day history of right ear pain.  He states the pain became acutely worse overnight and is now radiating to his right sinuses and right neck.  Patient was seen here yesterday; diagnosed with cerumen impaction.  He states his symptoms improved after the cerumen was removed but then became worse overnight.  He denies fever, chills, ear drainage, change in hearing, cough, or other symptoms.  Treatment attempted at home with Tylenol also.  Patient has history of endocarditis.  The history is provided by the patient and medical records.   Past Medical History:  Diagnosis Date   Coronary artery disease    History of occasional ear infections     Patient Active Problem List   Diagnosis Date Noted   Malignant tumor of urinary bladder ([place]) 08/17/2020   History of atrial fibrillation 07/02/2020   Acute left-sided low back pain with left-sided sciatica 06/07/2020   Abnormality of gait 06/07/2020   Gross hematuria 05/30/2020   Bradycardia 09/09/2019   Encounter for loop recorder at end of battery life 09/09/2019   PVC (premature ventricular contraction) 09/09/2019   OSA (obstructive sleep apnea) 08/19/2019   Difficulty sleeping 07/03/2019   Nasal congestion 07/03/2019   Waking at night short of breath 07/03/2019   Pes cavus 01/16/2019   Right peroneal tendinopathy 01/16/2019   Status post placement of implantable loop recorder 11/20/2018   Other specified postprocedural states 04/18/2016   Mitral regurgitation 07/15/2015   Endocarditis 07/08/2015   Gastroesophageal reflux disease without esophagitis 07/07/2015   Family history of prostate cancer 02/13/2013   History of myocardial infarction 02/12/2013   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 Authorizing Provider  acetaminophen (TYLENOL) 325 MG tablet Take by mouth. 08/18/20  Yes [provider]  amLODipine (NORVASC) 5 MG tablet Take 5 mg by mouth daily. 10/20/18  Yes [provider]  amoxicillin (AMOXIL) 875 MG tablet Take 1 tablet (875 mg total) by mouth 2 (two) times daily for 7 days. 01/21/21 01/28/21 Yes Sharion Balloon, NP  atorvastatin (LIPITOR) 10 MG tablet TAKE 1 TABLET BY MOUTH EVERYDAY AT BEDTIME 10/29/18  Yes [provider]  ELIQUIS 5 MG TABS tablet Take 5 mg by mouth 2 (two) times daily. 10/29/18  Yes [provider]  Multiple Vitamins-Minerals (VITRUM SENIOR) TABS    Yes [provider]  neomycin-polymyxin-hydrocortisone (CORTISPORIN) 3.5-10000-1 OTIC suspension Place 4 drops into the right ear 3 (three) times daily. 01/21/21  Yes Sharion Balloon, NP  aspirin EC 81 MG tablet Take by mouth. 07/24/15   [provider]  fluticasone (FLONASE) 50 MCG/ACT nasal spray Place into the nose. 04/24/19   [provider]  nitroGLYCERIN (NITROSTAT) 0.4 MG SL tablet Place under the tongue. 11/08/12   [provider]    Family History  Family History  Family history unknown: Yes    Social History Social History   Tobacco Use   Smoking status: Never   Smokeless tobacco: Never  Substance Use Topics   Alcohol use: Yes   Drug use: Never     Allergies   Patient has no known allergies.   Review of Systems Review of Systems  Constitutional:  Negative for chills and fever.  HENT:  Positive for ear pain.  Negative for ear discharge, hearing loss and sore throat.   Respiratory:  Negative for cough and shortness of breath.   Cardiovascular:  Negative for chest pain and palpitations.  Gastrointestinal:  Negative for abdominal pain and vomiting.  Skin:  Negative for color change and rash.  All other systems reviewed and are negative.   Physical Exam Triage Vital Signs ED Triage Vitals  Enc Vitals Group     BP 01/21/21 1120 (!) 143/90     Pulse Rate 01/21/21 1120 (!) 51     Resp 01/21/21 1120 18     Temp 01/21/21 1120 98.3 F (36.8 C)     Temp Source 01/21/21 1120 Oral     SpO2 01/21/21 1120 96 %     Weight --      Height --      Head Circumference --      Peak Flow --      Pain Score 01/21/21 1118 5     Pain Loc --      Pain Edu? --      Excl. in [place]? --    No data found.  Updated Vital Signs BP (!) 143/90 (BP Location: Left Arm)   Pulse (!) 51   Temp 98.3 F (36.8 C) (Oral)   Resp 18   SpO2 96%   Visual Acuity Right Eye Distance:   Left Eye Distance:   Bilateral Distance:    Right Eye Near:   Left Eye Near:    Bilateral Near:     Physical Exam Vitals and nursing note reviewed.  Constitutional:      Appearance: He is well-developed.  HENT:     Head: Normocephalic and atraumatic.     Right Ear: Swelling present. Tympanic membrane is erythematous.     Left Ear: Tympanic membrane and ear canal normal.     Nose: Nose normal.     Mouth/Throat:     Mouth: Mucous membranes are moist.     Pharynx: Oropharynx is clear.  Eyes:     Conjunctiva/sclera: Conjunctivae normal.  Cardiovascular:     Rate and Rhythm: Normal rate and regular rhythm.     Heart sounds: Normal heart sounds.  Pulmonary:     Effort: Pulmonary effort is normal. No respiratory distress.     Breath sounds: Normal breath sounds.  Abdominal:     Palpations: Abdomen is soft.     Tenderness: There is no abdominal tenderness.  Musculoskeletal:     Cervical back: Neck supple.  Skin:    General: Skin  is warm and dry.  Neurological:     General: No focal deficit present.     Mental Status: He is alert and oriented to person, place, and time.     Gait: Gait normal.  Psychiatric:        Mood and Affect: Mood normal.        Behavior: Behavior normal.     UC Treatments / Results  Labs (all labs ordered are listed, but only abnormal results are displayed) Labs Reviewed -  No data to display  EKG   Radiology No results found.  Procedures Procedures (including critical care time)  Medications Ordered in UC Medications - No data to display  Initial Impression / Assessment and Plan / UC Course  I have reviewed the triage vital signs and the nursing notes.  Pertinent labs & imaging results that were available during my care of the patient were reviewed by me and considered in my medical decision making (see chart for details).   Right otitis media.  Treating with amoxicillin and Cortisporin eardrops.  Continue Tylenol as needed for discomfort.  Instructed him to follow-up with his PCP if his symptoms or not improving.  He agrees to plan of care.   Final Clinical Impressions(s) / UC Diagnoses   Final diagnoses:  Right otitis media, unspecified otitis media type     Discharge Instructions      Take the amoxicillin as directed.  Use the ear drops as directed.    Follow up with your primary care provider if your symptoms are not improving.         ED Prescriptions     Medication Sig Dispense Auth. Provider   amoxicillin (AMOXIL) 875 MG tablet Take 1 tablet (875 mg total) by mouth 2 (two) times daily for 7 days. 14 tablet Barkley Boards H, NP   neomycin-polymyxin-hydrocortisone (CORTISPORIN) 3.5-10000-1 OTIC suspension Place 4 drops into the right ear 3 (three) times daily. 10 mL Sharion Balloon, NP      PDMP not reviewed this encounter.   Sharion Balloon, NP 01/21/21 [phone]

## 2021-01-21 NOTE — Discharge Instructions (Addendum)
Take the amoxicillin as directed.  Use the ear drops as directed.    Follow up with your primary care provider if your symptoms are not improving.

## 2021-01-26 ENCOUNTER — Encounter: Payer: Self-pay | Admitting: Urology

## 2021-01-26 ENCOUNTER — Ambulatory Visit (INDEPENDENT_AMBULATORY_CARE_PROVIDER_SITE_OTHER): Payer: Medicare Other | Admitting: Urology

## 2021-01-26 ENCOUNTER — Other Ambulatory Visit: Payer: Self-pay

## 2021-01-26 VITALS — BP 144/83 | HR 59 | Ht 68.0 in | Wt 175.0 lb

## 2021-01-26 DIAGNOSIS — C672 Malignant neoplasm of lateral wall of bladder: Secondary | ICD-10-CM | POA: Diagnosis not present

## 2021-01-26 DIAGNOSIS — R319 Hematuria, unspecified: Secondary | ICD-10-CM

## 2021-01-26 NOTE — Progress Notes (Signed)
Bladder cancer surveillance note  INDICATION History of bladder cancer  UROLOGIC HISTORY William Cook is a 70 y.o. male with history of a 3 cm lateral wall bladder tumor treated with TURBT in February 2022 with Dr. 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  Did not undergo second look TURBT.  Treatments for Bladder Cancer 07/2020 TURBT 09/2020 induction BCG x6   Cystoscopy Procedure Note:  After informed consent and discussion of the procedure and its risks, William Cook was positioned and prepped in the standard fashion. Cystoscopy was performed with the a flexible cystoscope. The urethra, bladder neck and entire bladder was visualized in a standard fashion.  Scar left lateral wall, no obvious recurrence, very mild erythema at edge of scar. The ureteral orifices were visualized in their normal location and orientation.  No abnormalities on retroflexion.  Cytology sent  If cytology negative or atypical, pursue ongoing maintenance BCG for 1 year total If cytology suspicious or positive, bladder biopsy and fulguration   Nickolas Madrid, MD 01/26/2021

## 2021-01-27 LAB — CYTOLOGY - NON PAP

## 2021-02-01 ENCOUNTER — Telehealth: Payer: Self-pay

## 2021-02-01 NOTE — Telephone Encounter (Signed)
Patient notified and scheduled for BCG and cysto appts. BCG instructions were sent via my chart as a reminder

## 2021-02-01 NOTE — Telephone Encounter (Signed)
-----   Message from Billey Co, MD sent at 02/01/2021  8:24 AM EDT ----- Regarding: BCG Good news, no cancer cells on urine cytology.  Please schecdule maintenance BCG X3 to start now, then cysto in 3-4 months  Nickolas Madrid, MD 02/01/2021   ----- Message ----- From: Interface, Lab In [facility] Sent: 01/27/2021   4:11 PM EDT To: Billey Co, MD

## 2021-02-04 ENCOUNTER — Other Ambulatory Visit: Payer: Self-pay

## 2021-02-04 ENCOUNTER — Ambulatory Visit (INDEPENDENT_AMBULATORY_CARE_PROVIDER_SITE_OTHER): Payer: Medicare Other

## 2021-02-04 DIAGNOSIS — C672 Malignant neoplasm of lateral wall of bladder: Secondary | ICD-10-CM | POA: Diagnosis not present

## 2021-02-04 DIAGNOSIS — R319 Hematuria, unspecified: Secondary | ICD-10-CM | POA: Diagnosis not present

## 2021-02-04 LAB — URINALYSIS, COMPLETE
Bilirubin, UA: NEGATIVE
Glucose, UA: NEGATIVE
Leukocytes,UA: NEGATIVE
Nitrite, UA: NEGATIVE
Protein,UA: NEGATIVE
RBC, UA: NEGATIVE
Specific Gravity, UA: 1.025 (ref 1.005–1.030)
Urobilinogen, Ur: 0.2 mg/dL (ref 0.2–1.0)
pH, UA: 6 (ref 5.0–7.5)

## 2021-02-04 LAB — MICROSCOPIC EXAMINATION
Bacteria, UA: NONE SEEN
RBC, Urine: NONE SEEN /hpf (ref 0–2)

## 2021-02-04 MED ORDER — BCG LIVE 50 MG IS SUSR
3.2400 mL | Freq: Once | INTRAVESICAL | Status: AC
  Administered 2021-02-04: 81 mg via INTRAVESICAL

## 2021-02-04 NOTE — Progress Notes (Signed)
BCG Bladder Instillation  BCG # 1  Due to Bladder Cancer patient is present today for a BCG treatment. Patient was cleaned and prepped in a sterile fashion with betadine. A 16 FR catheter was inserted, urine return was noted 15 ml, urine was yellow in color.  72m of reconstituted BCG was instilled into the bladder. The catheter was then removed. Patient tolerated well, no complications were noted  Preformed by: AKerman Passey RMA  Follow up/ Additional notes: 1 week for bcg

## 2021-02-04 NOTE — Patient Instructions (Signed)

## 2021-02-11 ENCOUNTER — Ambulatory Visit (INDEPENDENT_AMBULATORY_CARE_PROVIDER_SITE_OTHER): Payer: Medicare Other | Admitting: *Deleted

## 2021-02-11 ENCOUNTER — Other Ambulatory Visit: Payer: Self-pay

## 2021-02-11 DIAGNOSIS — C672 Malignant neoplasm of lateral wall of bladder: Secondary | ICD-10-CM | POA: Diagnosis not present

## 2021-02-11 DIAGNOSIS — R319 Hematuria, unspecified: Secondary | ICD-10-CM

## 2021-02-11 LAB — URINALYSIS, COMPLETE
Bilirubin, UA: NEGATIVE
Glucose, UA: NEGATIVE
Ketones, UA: NEGATIVE
Leukocytes,UA: NEGATIVE
Nitrite, UA: NEGATIVE
Protein,UA: NEGATIVE
Specific Gravity, UA: 1.025 (ref 1.005–1.030)
Urobilinogen, Ur: 0.2 mg/dL (ref 0.2–1.0)
pH, UA: 5.5 (ref 5.0–7.5)

## 2021-02-11 LAB — MICROSCOPIC EXAMINATION
Bacteria, UA: NONE SEEN
Epithelial Cells (non renal): NONE SEEN /hpf (ref 0–10)

## 2021-02-11 MED ORDER — BCG LIVE 50 MG IS SUSR
3.2400 mL | Freq: Once | INTRAVESICAL | Status: AC
  Administered 2021-02-11: 81 mg via INTRAVESICAL

## 2021-02-11 NOTE — Progress Notes (Signed)
BCG Bladder Instillation   BCG # 2   Due to Bladder Cancer patient is present today for a BCG treatment. Patient was cleaned and prepped in a sterile fashion with betadine. A 16 FR catheter was inserted, urine return was noted 15 ml, urine was yellow in color.  65m of reconstituted BCG was instilled into the bladder. The catheter was then removed. Patient tolerated well, no complications were noted   Preformed by: JGaspar Colaand CElberta LeatherwoodCMA   Follow up/ Additional notes: 1 week for bcg

## 2021-02-18 ENCOUNTER — Other Ambulatory Visit: Payer: Self-pay

## 2021-02-18 ENCOUNTER — Ambulatory Visit (INDEPENDENT_AMBULATORY_CARE_PROVIDER_SITE_OTHER): Payer: Medicare Other | Admitting: *Deleted

## 2021-02-18 DIAGNOSIS — R319 Hematuria, unspecified: Secondary | ICD-10-CM

## 2021-02-18 DIAGNOSIS — C672 Malignant neoplasm of lateral wall of bladder: Secondary | ICD-10-CM | POA: Diagnosis not present

## 2021-02-18 LAB — MICROSCOPIC EXAMINATION
Bacteria, UA: NONE SEEN
Epithelial Cells (non renal): NONE SEEN /hpf (ref 0–10)
RBC, Urine: NONE SEEN /hpf (ref 0–2)

## 2021-02-18 LAB — URINALYSIS, COMPLETE
Bilirubin, UA: NEGATIVE
Glucose, UA: NEGATIVE
Ketones, UA: NEGATIVE
Leukocytes,UA: NEGATIVE
Nitrite, UA: NEGATIVE
Protein,UA: NEGATIVE
RBC, UA: NEGATIVE
Specific Gravity, UA: 1.01 (ref 1.005–1.030)
Urobilinogen, Ur: 0.2 mg/dL (ref 0.2–1.0)
pH, UA: 5 (ref 5.0–7.5)

## 2021-02-18 MED ORDER — BCG LIVE 50 MG IS SUSR
3.2400 mL | Freq: Once | INTRAVESICAL | Status: AC
  Administered 2021-02-18: 81 mg via INTRAVESICAL

## 2021-02-18 NOTE — Progress Notes (Signed)
BCG Bladder Instillation  BCG # 3  Due to Bladder Cancer patient is present today for a BCG treatment. Patient was cleaned and prepped in a sterile fashion with betadine. A 14FR catheter was inserted, urine return was noted 25m, urine was yellow in color.  524mof reconstituted BCG was instilled into the bladder. The catheter was then removed. Patient tolerated well, no complications were noted  Preformed by: ReVerlene MayerCMA & Alise O'Sullivan,RMA  Follow up/ Additional notes: As scheduled

## 2021-05-02 ENCOUNTER — Ambulatory Visit (INDEPENDENT_AMBULATORY_CARE_PROVIDER_SITE_OTHER): Payer: Medicare Other | Admitting: Family Medicine

## 2021-05-02 ENCOUNTER — Other Ambulatory Visit: Payer: Self-pay

## 2021-05-02 ENCOUNTER — Ambulatory Visit: Payer: Self-pay

## 2021-05-02 ENCOUNTER — Encounter: Payer: Self-pay | Admitting: Urology

## 2021-05-02 ENCOUNTER — Ambulatory Visit (INDEPENDENT_AMBULATORY_CARE_PROVIDER_SITE_OTHER): Payer: Medicare Other | Admitting: Urology

## 2021-05-02 VITALS — BP 122/78 | Ht 68.0 in | Wt 185.0 lb

## 2021-05-02 VITALS — BP 148/87 | HR 61 | Ht 68.0 in | Wt 185.0 lb

## 2021-05-02 DIAGNOSIS — R972 Elevated prostate specific antigen [PSA]: Secondary | ICD-10-CM

## 2021-05-02 DIAGNOSIS — M25511 Pain in right shoulder: Secondary | ICD-10-CM

## 2021-05-02 DIAGNOSIS — C679 Malignant neoplasm of bladder, unspecified: Secondary | ICD-10-CM

## 2021-05-02 NOTE — Progress Notes (Signed)
PCP: Kayleen Memos, MD  Subjective:   HPI: Patient is a 70 y.o. male here for R shoulder pain.  William Cook has been having 8 days worth of right-sided anterior shoulder pain.  About 8 days ago, he played 2 matches of tennis in 1 day which is not usual for him.  He typically plays tennis 3 to 4 days a week for 1 to 2 hours at a time.  He started experiencing pain the next day, with associated popping in the shoulder.  He states that reaching up overhead and twisting his arm exacerbates the pain.  Resting makes his pain better.  Pain is 5-7/10.  He denies any swelling or nighttime symptoms.  He does have a history of biceps tendon rupture on the right side.  He has been doing shoulder exercises daily since that injury, but has not done any of the past 10 days.  He has not tried any ice, heat, or medication.   Past Medical History:  Diagnosis Date   Coronary artery disease    History of occasional ear infections     Current Outpatient Medications on File Prior to Visit  Medication Sig Dispense Refill   acetaminophen (TYLENOL) 325 MG tablet Take by mouth.     acyclovir cream (ZOVIRAX) 5 % Apply topically.     amLODipine (NORVASC) 5 MG tablet Take 5 mg by mouth daily.     amoxicillin (AMOXIL) 500 MG capsule 4 capsules one hour prior to dental procedures as needed     atorvastatin (LIPITOR) 10 MG tablet TAKE 1 TABLET BY MOUTH EVERYDAY AT BEDTIME     ELIQUIS 5 MG TABS tablet Take 5 mg by mouth 2 (two) times daily.     fluticasone (FLONASE) 50 MCG/ACT nasal spray Place into the nose.     Multiple Vitamins-Minerals (VITRUM SENIOR) TABS      neomycin-polymyxin-hydrocortisone (CORTISPORIN) 3.5-10000-1 OTIC suspension Place 4 drops into the right ear 3 (three) times daily. 10 mL 0   nitroGLYCERIN (NITROSTAT) 0.4 MG SL tablet Place under the tongue.     No current facility-administered medications on file prior to visit.    Past Surgical History:  Procedure Laterality Date   CARDIAC SURGERY       No Known Allergies  BP 122/78   Ht 5\' 8"  (1.727 m)   Wt 185 lb (83.9 kg)   BMI 28.13 kg/m   [facility] Adult Exercise 06/07/2020 07/15/2020 11/04/2020 05/02/2021  Frequency of aerobic exercise (# of days/week) 5 5 5 4   Average time in minutes 60 60 45 75  Frequency of strengthening activities (# of days/week) 3 2 3 3     No flowsheet data found.      Objective:  Physical Exam:  Gen: NAD, comfortable in exam room  MSK:  - R shoulder: R biceps retracted inferiorly with popeye deformity.  No swelling, bruising, other deformity.  No TTP of the anterior shoulder; full AROM and PROM, pain with active IR, 5/5 strength in abduction, IR, ER, weakly + hawkins, + Empty Can, - Sulcus sign, - Yergason's, - Speed's, - O'Brien's  Complete MSK u/s Right shoulder: Biceps tendon: torn and retracted to mid-upper arm. Pec major tendon: intact Subscapularis: intact without impingement on dynamic scan AC joint: minimal arthropathy Infraspinatus: intact without abnormalities Supraspinatus: mild subacromial bursitis.  Tiny partial thickness interstitial tear with calcification. Posterior glenohumeral joint: no effusion.  Posterior labrum normal  Impression: Subacromial bursitis, tiny partial thickness interstitial tear of supraspinatus, likely old.  Assessment & Plan:  1. Rotator cuff strain with subacromial bursitis - RC HEP with theraband and scapular stabilizer exercises. 3 sets of 10 daily.  - Tylenol or Voltaren gel prn for pain. Avoid oral NSAIDs - Avoid playing tennis for the next week while focusing on RC exercises x 1 week due to pain  - Hold off on current deltoid strengthening exercises - Gradual return to play with avoidance of OH serves when initially returning - If no improvement of pain, consider further imagine with MRI, formal PT, CSI, or nitroglycerin patches - F/u in 4 weeks or sooner if needed.  William Joseph, DO Family Medicine, PGY-3  Patient seen and  examined with resident.  Agree with her note and findings.  Patient with rotator cuff strain and subacromial bursitis.  No new rotator cuff tear visualized on ultrasound.  Retracted proximal biceps tendon tear is old.  Home exercises reviewed and discussed how to advance these.  Voltaren gel, consider formal PT.  F/u in 1 month.

## 2021-05-02 NOTE — Patient Instructions (Signed)

## 2021-05-02 NOTE — Patient Instructions (Signed)
You have subacromial bursitis, strain of your rotator cuff. Consider voltaren gel up to 4 times a day topically.  Don't take aleve or ibuprofen since you're taking eliquis. Can take tylenol in addition to this. Subacromial injection may be beneficial to help with pain and to decrease inflammation only if you're really struggling. Consider physical therapy with transition to home exercise program. Do home exercise program with theraband and scapular stabilization exercises daily 3 sets of 10 once a day. If not improving at follow-up we will consider further imaging, injection, physical therapy, and/or nitro patches. Wait about a week before returning to tennis but avoiding the overhand serves that first time for sure. Wait about 5 days before adding back your other shoulder exercises. Follow up with me in 1 month but call me sooner if you're struggling.

## 2021-05-02 NOTE — Progress Notes (Signed)
   05/02/2021 9:35 AM   William Cook Oct 29, 1950 [id number]  Reason for visit: History of bladder cancer, new elevated PSA  HPI: 70 year old male who underwent a TURBT of a 3 cm bladder tumor in February 2022 at [facility], and followed up here for BCG treatments, and has done well thus far with no recurrence.  His most recent cystoscopy was August 2022 and was benign.  A PSA was checked by PCP on 04/27/2021 and was elevated at 6.6 from prior value of 2.6 in November 2021.  No prior prostate biopsy.  He has a family history of nonlethal prostate cancer in his father that was reportedly treated with surgery and radiation, unclear Gleason score or staging.  DRE today 30 g prostate, smooth, no nodules or masses.  We discussed possible false positive elevation of the PSA in the setting of his BCG and catheterizations, as well as cystoscopies.  We reviewed the implications of an elevated PSA and the uncertainty surrounding it. In general, a man's PSA increases with age and is produced by both normal and cancerous prostate tissue. The differential diagnosis for elevated PSA includes BPH, prostate cancer, infection, recent intercourse/ejaculation, recent urethroscopic manipulation (foley placement/cystoscopy) or trauma, and prostatitis.   Management of an elevated PSA can include observation or prostate biopsy and we discussed this in detail. Our goal is to detect clinically significant prostate cancers, and manage with either active surveillance, surgery, or radiation for localized disease. Risks of prostate biopsy include bleeding, infection (including life threatening sepsis), pain, and lower urinary symptoms. Hematuria, hematospermia, and blood in the stool are all common after biopsy and can persist up to 4 weeks.   I recommended starting with a repeat PSA with reflex to free in 1 month, he will not have any BCG or cystoscopy during that time.  We discussed possible need for prostate MRI  or prostate biopsy in the future if PSA remains elevated  Keep scheduled follow-up in 1 month for cystoscopy for bladder cancer surveillance, we will draw PSA reflex to free at that visit prior to cystoscopy and call with results   Billey Co, [place] [address], [place], [place] [address] [phone]

## 2021-05-17 ENCOUNTER — Other Ambulatory Visit: Payer: Medicare Other

## 2021-05-24 ENCOUNTER — Other Ambulatory Visit: Payer: Medicare Other

## 2021-05-24 ENCOUNTER — Other Ambulatory Visit: Payer: Self-pay

## 2021-05-24 DIAGNOSIS — R972 Elevated prostate specific antigen [PSA]: Secondary | ICD-10-CM

## 2021-05-25 ENCOUNTER — Encounter: Payer: Self-pay | Admitting: Urology

## 2021-05-25 ENCOUNTER — Ambulatory Visit (INDEPENDENT_AMBULATORY_CARE_PROVIDER_SITE_OTHER): Payer: Medicare Other | Admitting: Urology

## 2021-05-25 VITALS — BP 140/73 | HR 58 | Ht 68.0 in | Wt 185.0 lb

## 2021-05-25 DIAGNOSIS — Z8551 Personal history of malignant neoplasm of bladder: Secondary | ICD-10-CM

## 2021-05-25 DIAGNOSIS — R972 Elevated prostate specific antigen [PSA]: Secondary | ICD-10-CM | POA: Diagnosis not present

## 2021-05-25 LAB — PSA TOTAL (REFLEX TO FREE): Prostate Specific Ag, Serum: 5.1 ng/mL — ABNORMAL HIGH (ref 0.0–4.0)

## 2021-05-25 LAB — URINALYSIS, COMPLETE
Bilirubin, UA: NEGATIVE
Glucose, UA: NEGATIVE
Leukocytes,UA: NEGATIVE
Nitrite, UA: NEGATIVE
Protein,UA: NEGATIVE
RBC, UA: NEGATIVE
Specific Gravity, UA: 1.025 (ref 1.005–1.030)
Urobilinogen, Ur: 0.2 mg/dL (ref 0.2–1.0)
pH, UA: 5.5 (ref 5.0–7.5)

## 2021-05-25 LAB — FPSA% REFLEX
% FREE PSA: 18 %
PSA, FREE: 0.92 ng/mL

## 2021-05-25 LAB — MICROSCOPIC EXAMINATION
Bacteria, UA: NONE SEEN
Epithelial Cells (non renal): NONE SEEN /hpf (ref 0–10)

## 2021-05-25 MED ORDER — LIDOCAINE HCL URETHRAL/MUCOSAL 2 % EX GEL
1.0000 "application " | Freq: Once | CUTANEOUS | Status: AC
  Administered 2021-05-25: 1 via URETHRAL

## 2021-05-25 NOTE — Progress Notes (Signed)
Bladder cancer surveillance note   INDICATION History of bladder cancer   UROLOGIC HISTORY William Cook is a 70 y.o. male with history of a 3 cm lateral wall bladder tumor treated with TURBT in February 2022 with Dr. 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  Did not undergo second look TURBT.   Treatments for Bladder Cancer 07/2020 TURBT 09/2020 induction BCG x6 01/2021: mBCG     Cystoscopy Procedure Note:   After informed consent and discussion of the procedure and its risks, William Cook was positioned and prepped in the standard fashion. Cystoscopy was performed with the a flexible cystoscope. The urethra, bladder neck and entire bladder was visualized in a standard fashion.  Scar left lateral wall, no obvious recurrence, minimal erythema at edge of scar. The ureteral orifices were visualized in their normal location and orientation.  No abnormalities on retroflexion.  Cytology   Follow-up cytology, if suspicious or positive pursue biopsy If negative, continue maintenance BCG to start now  ------------------------------------------------------------------------------------------------  He also had an elevated PSA of 6.6 from a baseline of 2.5, and we opted to repeat the PSA.  This dropped down to 5.1 with 18% free.  We discussed his mild elevation of PSA is likely secondary to his BCG treatments, catheterizations, and cystoscopy for his ongoing close surveillance of bladder cancer.  We discussed options including a repeat PSA over the next year when he has a gap of 2 to 3 months between BCG/cystoscopy, versus pursuing prostate MRI or biopsy.  He would like to repeat the PSA at some point in the next 9 months which I think is very reasonable.    William Madrid, MD 05/25/2021

## 2021-05-26 LAB — CYTOLOGY - NON PAP

## 2021-05-27 ENCOUNTER — Telehealth: Payer: Self-pay

## 2021-05-27 NOTE — Telephone Encounter (Signed)
-----   Message from Billey Co, MD sent at 05/26/2021  1:43 PM EST ----- Good news, no cancer cells on cytology. Please set up BCG maintenance to start now, followed by cysto in 3-4 months with me, thanks  Nickolas Madrid, MD 05/26/2021

## 2021-06-01 NOTE — Telephone Encounter (Signed)
Addressed in telephone encounter.

## 2021-06-01 NOTE — Telephone Encounter (Signed)
Patient was notified of results. Patient was notified that there is a temporary back order in BCG, pharmacy states should be back in stock by end of December. Per Dr. Diamantina Providence ok to hold maintenance treatment for a few weeks. Patient verbalized understanding. Will call back with an update at the end of the month with BCG back order update.

## 2021-07-04 ENCOUNTER — Telehealth: Payer: Self-pay | Admitting: Urology

## 2021-07-04 NOTE — Telephone Encounter (Signed)
Pt called office inquiring about his BCG treatment.  He sent a MyChart message to Judson Roch last Friday and was calling today to speak to her concerning BCG.

## 2021-07-06 NOTE — Telephone Encounter (Signed)
Spoke to patient and advised him of the supply issues of the BCG.  He stated that he understood and just following up to see when he can get scheduled for his maintenance dose.  He would prefer Friday mornings or Monday mornings.

## 2021-07-06 NOTE — Telephone Encounter (Signed)
Patient called back wanting to when his BCG was going to be scheduled.

## 2021-07-07 NOTE — Telephone Encounter (Signed)
BCG shipment received.   Patient has been scheduled for 3 maintenance doses of BCG and a cysto appt with [place] 3-4 months after.  Patient expressed understanding.

## 2021-07-07 NOTE — Telephone Encounter (Signed)
BCG shipment received.  Patient has been scheduled for 3 maintenance doses of BCG and a cysto appt with Dr. Diamantina Providence 3-4 months after.  Patient expressed understanding.

## 2021-07-08 ENCOUNTER — Ambulatory Visit (INDEPENDENT_AMBULATORY_CARE_PROVIDER_SITE_OTHER): Payer: Medicare Other

## 2021-07-08 ENCOUNTER — Other Ambulatory Visit: Payer: Self-pay

## 2021-07-08 DIAGNOSIS — R319 Hematuria, unspecified: Secondary | ICD-10-CM

## 2021-07-08 DIAGNOSIS — C672 Malignant neoplasm of lateral wall of bladder: Secondary | ICD-10-CM | POA: Diagnosis not present

## 2021-07-08 LAB — URINALYSIS, COMPLETE
Bilirubin, UA: NEGATIVE
Glucose, UA: NEGATIVE
Leukocytes,UA: NEGATIVE
Nitrite, UA: NEGATIVE
Protein,UA: NEGATIVE
RBC, UA: NEGATIVE
Specific Gravity, UA: 1.025 (ref 1.005–1.030)
Urobilinogen, Ur: 0.2 mg/dL (ref 0.2–1.0)
pH, UA: 5.5 (ref 5.0–7.5)

## 2021-07-08 LAB — MICROSCOPIC EXAMINATION
Bacteria, UA: NONE SEEN
Epithelial Cells (non renal): NONE SEEN /hpf (ref 0–10)

## 2021-07-08 MED ORDER — BCG LIVE 50 MG IS SUSR
3.2400 mL | Freq: Once | INTRAVESICAL | Status: AC
  Administered 2021-07-08: 81 mg via INTRAVESICAL

## 2021-07-08 NOTE — Progress Notes (Signed)
BCG Bladder Instillation  BCG # Maintenance #1  Due to Bladder Cancer patient is present today for a BCG treatment. Patient was cleaned and prepped in a sterile fashion with betadine. A 14FR catheter was inserted, urine return was noted 83ml, urine was yellow in color.  32ml of reconstituted BCG was instilled into the bladder. The catheter was then removed. Patient tolerated well, no complications were noted.  Performed by: Verlene Mayer, [place], CMA   Follow up/ Additional notes: RTC in 1 week as scheduled.

## 2021-07-08 NOTE — Patient Instructions (Signed)

## 2021-07-15 ENCOUNTER — Ambulatory Visit (INDEPENDENT_AMBULATORY_CARE_PROVIDER_SITE_OTHER): Payer: Medicare Other

## 2021-07-15 ENCOUNTER — Other Ambulatory Visit: Payer: Self-pay

## 2021-07-15 DIAGNOSIS — Z8551 Personal history of malignant neoplasm of bladder: Secondary | ICD-10-CM

## 2021-07-15 LAB — URINALYSIS, COMPLETE
Bilirubin, UA: NEGATIVE
Glucose, UA: NEGATIVE
Leukocytes,UA: NEGATIVE
Nitrite, UA: NEGATIVE
Protein,UA: NEGATIVE
RBC, UA: NEGATIVE
Specific Gravity, UA: 1.02 (ref 1.005–1.030)
Urobilinogen, Ur: 0.2 mg/dL (ref 0.2–1.0)
pH, UA: 6.5 (ref 5.0–7.5)

## 2021-07-15 LAB — MICROSCOPIC EXAMINATION
Bacteria, UA: NONE SEEN
Epithelial Cells (non renal): NONE SEEN /hpf (ref 0–10)

## 2021-07-15 MED ORDER — BCG LIVE 50 MG IS SUSR
3.2400 mL | Freq: Once | INTRAVESICAL | Status: AC
  Administered 2021-07-15: 81 mg via INTRAVESICAL

## 2021-07-15 NOTE — Progress Notes (Signed)
BCG Bladder Instillation  BCG # Maintenance #2   Due to Bladder Cancer patient is present today for a BCG treatment. Patient was cleaned and prepped in a sterile fashion with betadine. A 14FR catheter was inserted, urine return was noted 17ml, urine was yellow in color. 46ml of reconstituted BCG was instilled into the bladder. The catheter was then removed. Patient tolerated well, no complications were noted.  Performed by: Gordy Clement, [place], CMA  Follow up/ Additional notes: RTC in 1 week

## 2021-07-22 ENCOUNTER — Ambulatory Visit (INDEPENDENT_AMBULATORY_CARE_PROVIDER_SITE_OTHER): Payer: Medicare Other

## 2021-07-22 ENCOUNTER — Other Ambulatory Visit: Payer: Self-pay

## 2021-07-22 DIAGNOSIS — R972 Elevated prostate specific antigen [PSA]: Secondary | ICD-10-CM

## 2021-07-22 DIAGNOSIS — N3289 Other specified disorders of bladder: Secondary | ICD-10-CM

## 2021-07-22 DIAGNOSIS — C672 Malignant neoplasm of lateral wall of bladder: Secondary | ICD-10-CM | POA: Diagnosis not present

## 2021-07-22 LAB — MICROSCOPIC EXAMINATION: Bacteria, UA: NONE SEEN

## 2021-07-22 LAB — URINALYSIS, COMPLETE
Bilirubin, UA: NEGATIVE
Glucose, UA: NEGATIVE
Leukocytes,UA: NEGATIVE
Nitrite, UA: NEGATIVE
Protein,UA: NEGATIVE
Specific Gravity, UA: >1.03 — ABNORMAL HIGH (ref 1.005–1.030)
Urobilinogen, Ur: 0.2 mg/dL (ref 0.2–1.0)
pH, UA: 5.5 (ref 5.0–7.5)

## 2021-07-22 MED ORDER — MIRABEGRON ER 50 MG PO TB24: 50.0000 mg | ORAL_TABLET | Freq: Every day | ORAL | 0 refills | Status: DC

## 2021-07-22 MED ORDER — BCG LIVE 50 MG IS SUSR
3.2400 mL | Freq: Once | INTRAVESICAL | Status: AC
  Administered 2021-07-22: 81 mg via INTRAVESICAL

## 2021-07-22 NOTE — Progress Notes (Signed)
BCG Bladder Instillation  BCG # Maintenance #3   Due to Bladder Cancer patient is present today for a BCG treatment. Patient was cleaned and prepped in a sterile fashion with betadine. A 14FR catheter was inserted, urine return was noted 70ml, urine was yellow in color.  56ml of reconstituted BCG was instilled into the bladder. The catheter was then removed. Patient tolerated well, no complications were noted. Patient notes that he was not able to hold his urine the last 15 minutes of his treatment due to bladder spasms. Patient given sample of Myrbetriq today to help control bladder spasm.   Performed by: Edwin Dada, [place], CMA   Follow up/ Additional notes: RTC as scheduled for Cysto

## 2021-07-25 NOTE — Telephone Encounter (Signed)
Common to have RBCs in urine during BCG treatments, nothing to worry about  William Madrid, MD 07/25/2021

## 2021-07-27 ENCOUNTER — Ambulatory Visit: Payer: Medicare Other | Admitting: Family Medicine

## 2021-08-01 ENCOUNTER — Ambulatory Visit (INDEPENDENT_AMBULATORY_CARE_PROVIDER_SITE_OTHER): Payer: Medicare Other | Admitting: Family Medicine

## 2021-08-01 ENCOUNTER — Encounter: Payer: Self-pay | Admitting: Family Medicine

## 2021-08-01 VITALS — BP 130/78 | Ht 68.0 in | Wt 180.0 lb

## 2021-08-01 DIAGNOSIS — M25511 Pain in right shoulder: Secondary | ICD-10-CM | POA: Diagnosis present

## 2021-08-01 NOTE — Progress Notes (Signed)
PCP: Kayleen Memos, MD  Subjective:   HPI: Patient is a 71 y.o. male here for R shoulder pain follow up  He was seen 05/12/21, ultrasound was performed and he was found to have subacromial bursitis, tiny partial thickness interstitial tear of supraspinatus, likely old, rotator cuff strain with subacromial bursitis. He was advised to do exercises, and ice, as well as take Tylenol and use Voltaren gel as needed.   Today presents for follow up and states he is doing well, feels a lot of improvement. Feels injury was tennis related and now has been able to plan tennis again with almost full function, about 80% strength. Still endorses pain after activity. Endorses pain when changing positions typically from overhead to lowering his arms will get a pain and then a pop with the pain improving after that. Can keep him awake at night, preventing him from falling asleep but not wake him up out of sleep. Pain/popping occurs about a dozen times a day Popping is occurring less. Still ices it and does rehab exercises.    Past Medical History:  Diagnosis Date   Coronary artery disease    History of occasional ear infections     Current Outpatient Medications on File Prior to Visit  Medication Sig Dispense Refill   acetaminophen (TYLENOL) 325 MG tablet Take by mouth.     acyclovir cream (ZOVIRAX) 5 % Apply topically.     amLODipine (NORVASC) 5 MG tablet Take 5 mg by mouth daily.     amoxicillin (AMOXIL) 500 MG capsule 4 capsules one hour prior to dental procedures as needed     atorvastatin (LIPITOR) 10 MG tablet TAKE 1 TABLET BY MOUTH EVERYDAY AT BEDTIME     ELIQUIS 5 MG TABS tablet Take 5 mg by mouth 2 (two) times daily.     fluticasone (FLONASE) 50 MCG/ACT nasal spray Place into the nose.     mirabegron ER (MYRBETRIQ) 50 MG TB24 tablet Take 1 tablet (50 mg total) by mouth daily. 30 tablet 0   Multiple Vitamins-Minerals (VITRUM SENIOR) TABS      neomycin-polymyxin-hydrocortisone (CORTISPORIN)  3.5-10000-1 OTIC suspension Place 4 drops into the right ear 3 (three) times daily. 10 mL 0   nitroGLYCERIN (NITROSTAT) 0.4 MG SL tablet Place under the tongue.     No current facility-administered medications on file prior to visit.    Past Surgical History:  Procedure Laterality Date   CARDIAC SURGERY      No Known Allergies  There were no vitals taken for this visit.  [facility] Adult Exercise 06/07/2020 07/15/2020 11/04/2020 05/02/2021  Frequency of aerobic exercise (# of days/week) 5 5 5 4   Average time in minutes 60 60 45 75  Frequency of strengthening activities (# of days/week) 3 2 3 3     No flowsheet data found.      Objective:  Physical Exam:  Gen: NAD, comfortable in exam room  R Shoulder: Inspection reveals popeye deformity of R biceps. No other deformity, bruising, or swelling Palpation is normal with no TTP over [facility] joint or bicipital groove b/l. Full ROM in flexion, abduction, internal/external rotation b/l NV intact distally b/l Normal scapular function observed b/l Special Tests:  - Impingement: Neg Hawkins, empty can sign. Mild positive Neers - Supraspinatous: Negative empty can - Infraspinatous/Teres Minor: 5/5 strength with ER - Subscapularis: 5/5 strength with IR - Biceps tendon: Negative Speeds, Yerrgason's  - Labrum: Negative Obriens, negative clunk, good stability - AC Joint: Negative cross arm -  Negative apprehension test - No painful arc and no drop arm sign    Assessment & Plan:  1. Rotator cuff strain with subacromial bursitis  Patient presents for follow up and reports he has been doing his rehab and icing, feeling improvement (80% strength) though still experiencing pain/popping when in certain positions. Reassuring that physical exam is much improved from when he was here in November. Given this improvement opted not pursue further imaging. Advised patient to continue his exercises 3 times a week. Discussed potentially adding  nitroglycerin patches, but that he likely does not need it at this point. Patient agreeable with plan and will follow up if needed.

## 2021-08-23 NOTE — Telephone Encounter (Signed)
Would not worry about the urine discoloration, this is common after BCG especially in patients on anticoagulation.  Keep follow-up as scheduled for ongoing surveillance cystoscopy and continuing BCG treatments  Nickolas Madrid, MD 08/23/2021

## 2021-10-12 ENCOUNTER — Other Ambulatory Visit: Payer: Self-pay | Admitting: Orthopedic Surgery

## 2021-10-12 DIAGNOSIS — M25511 Pain in right shoulder: Secondary | ICD-10-CM

## 2021-10-20 ENCOUNTER — Other Ambulatory Visit: Payer: Medicare Other

## 2021-10-20 DIAGNOSIS — R972 Elevated prostate specific antigen [PSA]: Secondary | ICD-10-CM

## 2021-10-21 LAB — PSA: Prostate Specific Ag, Serum: 5 ng/mL — ABNORMAL HIGH (ref 0.0–4.0)

## 2021-10-24 ENCOUNTER — Ambulatory Visit
Admission: RE | Admit: 2021-10-24 | Discharge: 2021-10-24 | Disposition: A | Discharge status: Home / Self Care | Payer: Medicare Other | Source: Ambulatory Visit | Attending: Orthopedic Surgery | Admitting: Orthopedic Surgery

## 2021-10-24 ENCOUNTER — Other Ambulatory Visit: Payer: Medicare Other

## 2021-10-24 DIAGNOSIS — M25511 Pain in right shoulder: Secondary | ICD-10-CM | POA: Diagnosis not present

## 2021-10-26 ENCOUNTER — Other Ambulatory Visit: Payer: Medicare Other | Admitting: Urology

## 2021-11-03 ENCOUNTER — Ambulatory Visit (INDEPENDENT_AMBULATORY_CARE_PROVIDER_SITE_OTHER): Payer: Medicare Other | Admitting: Urology

## 2021-11-03 ENCOUNTER — Encounter: Payer: Self-pay | Admitting: Urology

## 2021-11-03 VITALS — BP 147/87 | HR 64 | Ht 68.0 in | Wt 180.0 lb

## 2021-11-03 DIAGNOSIS — C672 Malignant neoplasm of lateral wall of bladder: Secondary | ICD-10-CM

## 2021-11-03 DIAGNOSIS — Z8551 Personal history of malignant neoplasm of bladder: Secondary | ICD-10-CM

## 2021-11-03 DIAGNOSIS — R972 Elevated prostate specific antigen [PSA]: Secondary | ICD-10-CM | POA: Diagnosis not present

## 2021-11-03 NOTE — Progress Notes (Signed)
Bladder cancer surveillance note ?  ?INDICATION ?History of bladder cancer ?  ?UROLOGIC HISTORY ?William Cook is a 71 y.o. male with history of a 3 cm lateral wall bladder tumor treated with TURBT in February 2022 with Dr. Rosana Hoes at [facility].  This was diagnosed on an ultrasound. ?  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  Did not undergo second look TURBT. ?  ?Treatments for Bladder Cancer ?07/2020 TURBT ?09/2020 induction BCG x6 ?01/2021: mBCG ?06/2021: mBCG ? ?He had a fair amount of urinary frequency, urgency and persistent urinary symptoms after his BCG from January that have only recently resolved in the last few weeks ?  ?  ?Cystoscopy Procedure Note: ?  ?After informed consent and discussion of the procedure and its risks, William Cook was positioned and prepped in the standard fashion. Cystoscopy was performed with the a flexible cystoscope. The urethra, bladder neck and entire bladder was visualized in a standard fashion.  Scar left lateral wall, no obvious recurrence, minimal erythema at edge of scar. The ureteral orifices were visualized in their normal location and orientation.  No abnormalities on retroflexion.  Cytology sent. ? ?Follow-up cytology, if suspicious or positive pursue biopsy ?If negative, he opts to defer further BCG and continue surveillance cystoscopy in 4 months ? ?------------------------------------------------------------------------------------------------ ? ?He also has a history of a PSA as high as 6.6, and this dropped down to 5.1 with 18% free, and repeat PSA recently remained stable at 5.0.  We discussed possible etiologies including BPH, prostate cancer, or more likely elevated secondary to multiple BCG treatments and cystoscopy, especially in the settings of his persistent irritative urinary symptoms after most recent BCG. ? ?We discussed options including repeat PSA in 4 months,  prostate MRI, or prostate biopsy.  Risks and benefits discussed at length and using shared decision making he opted for a repeat PSA in 4 months. ? ?RTC 4 months for surveillance cystoscopy, will repeat PSA with reflex to free prior ?He defers final BCG with severe irritative urinary symptoms after most recent maintenance BCG ?  ? ?William Madrid, MD ?11/03/2021 ? ? ?  ?  ? ?

## 2021-11-03 NOTE — Patient Instructions (Signed)

## 2021-11-04 LAB — MICROSCOPIC EXAMINATION: RBC, Urine: >30 /hpf — AB (ref 0–2)

## 2021-11-04 LAB — URINALYSIS, COMPLETE
Bilirubin, UA: NEGATIVE
Glucose, UA: NEGATIVE
Ketones, UA: NEGATIVE
Leukocytes,UA: NEGATIVE
Nitrite, UA: NEGATIVE
Protein,UA: NEGATIVE
Specific Gravity, UA: >1.03 — ABNORMAL HIGH (ref 1.005–1.030)
Urobilinogen, Ur: 0.2 mg/dL (ref 0.2–1.0)
pH, UA: 6 (ref 5.0–7.5)

## 2021-11-07 LAB — CYTOLOGY - NON PAP

## 2021-11-28 NOTE — Telephone Encounter (Signed)
Would recommend UA and culture to rule out UTI, can offer PA visit with [place] tomorrow if able, or urgent care/PCP  Nickolas Madrid, MD 11/28/2021

## 2021-11-29 ENCOUNTER — Encounter: Payer: Self-pay | Admitting: Physician Assistant

## 2021-11-29 ENCOUNTER — Ambulatory Visit (INDEPENDENT_AMBULATORY_CARE_PROVIDER_SITE_OTHER): Payer: Medicare Other | Admitting: Physician Assistant

## 2021-11-29 VITALS — Ht 68.0 in | Wt 185.0 lb

## 2021-11-29 DIAGNOSIS — R310 Gross hematuria: Secondary | ICD-10-CM | POA: Diagnosis not present

## 2021-11-29 LAB — URINALYSIS, COMPLETE
Bilirubin, UA: NEGATIVE
Glucose, UA: NEGATIVE
Ketones, UA: NEGATIVE
Leukocytes,UA: NEGATIVE
Nitrite, UA: NEGATIVE
Protein,UA: NEGATIVE
Specific Gravity, UA: 1.025 (ref 1.005–1.030)
Urobilinogen, Ur: 0.2 mg/dL (ref 0.2–1.0)
pH, UA: 5.5 (ref 5.0–7.5)

## 2021-11-29 LAB — MICROSCOPIC EXAMINATION
Bacteria, UA: NONE SEEN
RBC, Urine: >30 /hpf — AB (ref 0–2)

## 2021-11-29 NOTE — Progress Notes (Signed)
11/29/2021 5:08 PM   William Cook 01-02-1951 [id number]  CC: Chief Complaint  Patient presents with   Hematuria   HPI: William Cook is a 71 y.o. 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   Today he reports an episode of gross hematuria approximately 4 days ago after lap swimming that was associated with dysuria and strong urgency.  He denies clot passage.  Gross hematuria has since resolved.  He underwent surveillance cystoscopy with Dr. Diamantina Providence on 11/03/2021 with no evidence of recurrent disease.  Urine cytology was negative.  In-office UA today positive for 3+ blood; urine microscopy with >30 RBCs/HPF.  PMH: Past Medical History:  Diagnosis Date   Coronary artery disease    History of occasional ear infections     Surgical History: Past Surgical History:  Procedure Laterality Date   CARDIAC SURGERY      Home Medications:  Allergies as of 11/29/2021   No Known Allergies      Medication List        Accurate as of November 29, 2021  5:08 PM. If you have any questions, ask your nurse or doctor.          acetaminophen 325 MG tablet Commonly known as: TYLENOL Take by mouth.   acyclovir cream 5 % Commonly known as: ZOVIRAX Apply topically.   amLODipine 5 MG tablet Commonly known as: NORVASC Take 5 mg by mouth daily.   amoxicillin 500 MG capsule Commonly known as: AMOXIL 4 capsules one hour prior to dental procedures as needed   atorvastatin 10 MG tablet Commonly known as: LIPITOR TAKE 1 TABLET BY MOUTH EVERYDAY AT BEDTIME   Eliquis 5 MG Tabs tablet Generic drug: apixaban Take 5 mg by mouth 2 (two) times daily.   fluticasone 50 MCG/ACT nasal spray Commonly known as: FLONASE Place into the nose.   mirabegron ER 50 MG Tb24 tablet Commonly known as: MYRBETRIQ Take 1 tablet (50 mg total) by mouth daily.   neomycin-polymyxin-hydrocortisone 3.5-10000-1 OTIC  suspension Commonly known as: CORTISPORIN Place 4 drops into the right ear 3 (three) times daily.   nitroGLYCERIN 0.4 MG SL tablet Commonly known as: NITROSTAT Place under the tongue.   Vitrum Senior Tabs        Allergies:  No Known Allergies  Family History: Family History  Family history unknown: Yes    Social History:   reports that he has never smoked. He has never been exposed to tobacco smoke. He has never used smokeless tobacco. He reports current alcohol use. He reports that he does not use drugs.  Physical Exam: Ht '5\' 8"'$  (1.727 m)   Wt 185 lb (83.9 kg)   BMI 28.13 kg/m   Constitutional:  Alert and oriented, no acute distress, nontoxic appearing HEENT: [place], AT Cardiovascular: No clubbing, cyanosis, or edema Respiratory: Normal respiratory effort, no increased work of breathing Skin: No rashes, bruises or suspicious lesions Neurologic: Grossly intact, no focal deficits, moving all 4 extremities Psychiatric: Normal mood and affect  Laboratory Data: Results for orders placed or performed in visit on 11/29/21  Microscopic Examination   Urine  Result Value Ref Range   WBC, UA 0-5 0 - 5 /hpf   RBC >30 (A) 0 - 2 /hpf   Epithelial Cells (non renal) 0-10 0 - 10 /hpf   Bacteria, UA None seen None seen/Few  Urinalysis, Complete  Result Value Ref Range   Specific Gravity, UA 1.025 1.005 -  1.030   pH, UA 5.5 5.0 - 7.5   Color, UA Yellow Yellow   Appearance Ur Clear Clear   Leukocytes,UA Negative Negative   Protein,UA Negative Negative/Trace   Glucose, UA Negative Negative   Ketones, UA Negative Negative   RBC, UA 3+ (A) Negative   Bilirubin, UA Negative Negative   Urobilinogen, Ur 0.2 0.2 - 1.0 mg/dL   Nitrite, UA Negative Negative   Microscopic Examination See below:    Assessment & Plan:   1. Gross hematuria No evidence of urinary infection today and he is up-to-date on surveillance cystoscopy and imaging.  We discussed warning signs of gross hematuria  including large clot passage, passage of thick, ketchup-like urine, or the inability to urinate.  Okay to keep scheduled surveillance cystoscopy as it is.  He is in agreement with this plan. - Urinalysis, Complete  Return if symptoms worsen or fail to improve.  Debroah Loop, PA-C  [facility] [address], [place], [place] [address] [phone]

## 2022-03-07 ENCOUNTER — Other Ambulatory Visit: Payer: Medicare Other

## 2022-03-07 DIAGNOSIS — R972 Elevated prostate specific antigen [PSA]: Secondary | ICD-10-CM

## 2022-03-09 ENCOUNTER — Encounter: Payer: Self-pay | Admitting: Urology

## 2022-03-09 ENCOUNTER — Ambulatory Visit (INDEPENDENT_AMBULATORY_CARE_PROVIDER_SITE_OTHER): Payer: Medicare Other | Admitting: Urology

## 2022-03-09 VITALS — BP 160/86 | HR 56 | Ht 68.0 in | Wt 187.0 lb

## 2022-03-09 DIAGNOSIS — R310 Gross hematuria: Secondary | ICD-10-CM

## 2022-03-09 DIAGNOSIS — Z8551 Personal history of malignant neoplasm of bladder: Secondary | ICD-10-CM

## 2022-03-09 DIAGNOSIS — C672 Malignant neoplasm of lateral wall of bladder: Secondary | ICD-10-CM

## 2022-03-09 LAB — URINALYSIS, COMPLETE
Bilirubin, UA: NEGATIVE
Glucose, UA: NEGATIVE
Ketones, UA: NEGATIVE
Leukocytes,UA: NEGATIVE
Nitrite, UA: NEGATIVE
Protein,UA: NEGATIVE
RBC, UA: NEGATIVE
Specific Gravity, UA: 1.015 (ref 1.005–1.030)
Urobilinogen, Ur: 0.2 mg/dL (ref 0.2–1.0)
pH, UA: 5 (ref 5.0–7.5)

## 2022-03-09 LAB — FPSA% REFLEX
% FREE PSA: 18.9 %
PSA, FREE: 0.83 ng/mL

## 2022-03-09 LAB — MICROSCOPIC EXAMINATION: Bacteria, UA: NONE SEEN

## 2022-03-09 LAB — PSA TOTAL (REFLEX TO FREE): Prostate Specific Ag, Serum: 4.4 ng/mL — ABNORMAL HIGH (ref 0.0–4.0)

## 2022-03-09 MED ORDER — LIDOCAINE HCL URETHRAL/MUCOSAL 2 % EX GEL
1.0000 | Freq: Once | CUTANEOUS | Status: AC
  Administered 2022-03-09: 1 via URETHRAL

## 2022-03-09 NOTE — Progress Notes (Signed)
Bladder cancer surveillance note   INDICATION History of bladder cancer   UROLOGIC HISTORY William Cook is a 71 y.o. male with history of a 3 cm lateral wall bladder tumor treated with TURBT in February 2022 with Dr. Rosana Hoes at [facility].  This was diagnosed on an ultrasound.   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  Did not undergo second look TURBT.   Treatments for Bladder Cancer 07/2020 TURBT 09/2020 induction BCG x6 01/2021: mBCG 06/2021: mBCG  He had a fair amount of urinary frequency, urgency and persistent urinary symptoms after his BCG from January, and opted to defer further BCG.     Cystoscopy Procedure Note:   After informed consent and discussion of the procedure and its risks, William Cook was positioned and prepped in the standard fashion. Cystoscopy was performed with the a flexible cystoscope. The urethra, bladder neck and entire bladder was visualized in a standard fashion.  Scar left lateral wall, no obvious recurrence, minimal erythema at edge of scar. The ureteral orifices were visualized in their normal location and orientation.  No abnormalities on retroflexion.  Cytology sent.   He also has a history of a PSA as high as 6.6, and this dropped down to 5.1 with 18% free, and repeat PSA recently remained stable at 5.0.  We discussed possible etiologies including BPH, prostate cancer, or more likely elevated secondary to multiple BCG treatments and cystoscopy, especially in the settings of his persistent irritative urinary symptoms after most recent BCG.  Most recent PSA continues to decrease, 4.4(19% free) on 03/07/2022  Using shared decision making he opted for a CT urogram for routine bladder cancer surveillance.  We can also obtain prostate volume from the scan and have a better sense of how aggressive we need to be in terms of work-up of his very mildly elevated PSA that has  decreased over the last year.  Follow-up cytology CT urogram for routine surveillance of bladder cancer-we will calculate PSA density RTC 4 months cystoscopy, can space to every 6 months at that time if negative    Nickolas Madrid, MD 03/09/2022

## 2022-03-13 LAB — CYTOLOGY - NON PAP

## 2022-05-09 ENCOUNTER — Other Ambulatory Visit: Payer: Self-pay | Admitting: Orthopedic Surgery

## 2022-05-23 ENCOUNTER — Encounter: Payer: Self-pay | Admitting: Orthopedic Surgery

## 2022-05-23 ENCOUNTER — Other Ambulatory Visit: Payer: Self-pay

## 2022-05-23 NOTE — Progress Notes (Signed)
Needs medical clearance and [facility]. Not here. ja

## 2022-05-26 ENCOUNTER — Ambulatory Visit
Admission: RE | Admit: 2022-05-26 | Discharge: 2022-05-26 | Disposition: A | Discharge status: Home / Self Care | Payer: Medicare Other | Attending: Orthopedic Surgery | Admitting: Orthopedic Surgery

## 2022-05-26 ENCOUNTER — Encounter
Admission: RE | Disposition: A | Discharge status: Home / Self Care | Payer: Self-pay | Source: Home / Self Care | Attending: Orthopedic Surgery

## 2022-05-26 ENCOUNTER — Encounter: Payer: Self-pay | Admitting: Orthopedic Surgery

## 2022-05-26 ENCOUNTER — Other Ambulatory Visit: Payer: Self-pay

## 2022-05-26 ENCOUNTER — Ambulatory Visit: Payer: Self-pay | Admitting: Anesthesiology

## 2022-05-26 ENCOUNTER — Encounter: Payer: Self-pay | Admitting: Anesthesiology

## 2022-05-26 ENCOUNTER — Ambulatory Visit: Payer: Self-pay

## 2022-05-26 DIAGNOSIS — I214 Non-ST elevation (NSTEMI) myocardial infarction: Secondary | ICD-10-CM

## 2022-05-26 DIAGNOSIS — M659 Synovitis and tenosynovitis, unspecified: Secondary | ICD-10-CM | POA: Insufficient documentation

## 2022-05-26 DIAGNOSIS — X58XXXA Exposure to other specified factors, initial encounter: Secondary | ICD-10-CM | POA: Diagnosis not present

## 2022-05-26 DIAGNOSIS — S46211A Strain of muscle, fascia and tendon of other parts of biceps, right arm, initial encounter: Secondary | ICD-10-CM | POA: Diagnosis not present

## 2022-05-26 DIAGNOSIS — I4891 Unspecified atrial fibrillation: Secondary | ICD-10-CM | POA: Insufficient documentation

## 2022-05-26 DIAGNOSIS — K219 Gastro-esophageal reflux disease without esophagitis: Secondary | ICD-10-CM | POA: Insufficient documentation

## 2022-05-26 DIAGNOSIS — G4730 Sleep apnea, unspecified: Secondary | ICD-10-CM | POA: Insufficient documentation

## 2022-05-26 DIAGNOSIS — Z952 Presence of prosthetic heart valve: Secondary | ICD-10-CM | POA: Insufficient documentation

## 2022-05-26 DIAGNOSIS — M75101 Unspecified rotator cuff tear or rupture of right shoulder, not specified as traumatic: Secondary | ICD-10-CM | POA: Insufficient documentation

## 2022-05-26 DIAGNOSIS — Z8551 Personal history of malignant neoplasm of bladder: Secondary | ICD-10-CM | POA: Diagnosis not present

## 2022-05-26 DIAGNOSIS — Z951 Presence of aortocoronary bypass graft: Secondary | ICD-10-CM | POA: Diagnosis not present

## 2022-05-26 DIAGNOSIS — Z9221 Personal history of antineoplastic chemotherapy: Secondary | ICD-10-CM | POA: Diagnosis not present

## 2022-05-26 HISTORY — DX: Malignant neoplasm of bladder, unspecified: C67.9

## 2022-05-26 HISTORY — PX: SHOULDER ARTHROSCOPY WITH OPEN ROTATOR CUFF REPAIR: SHX6092

## 2022-05-26 HISTORY — DX: Sleep apnea, unspecified: G47.30

## 2022-05-26 HISTORY — DX: Personal history of other diseases of the circulatory system: Z86.79

## 2022-05-26 SURGERY — ARTHROSCOPY, SHOULDER WITH REPAIR, ROTATOR CUFF, OPEN
Anesthesia: General | Site: Shoulder | Laterality: Right

## 2022-05-26 MED ORDER — ROCURONIUM BROMIDE 100 MG/10ML IV SOLN
INTRAVENOUS | Status: DC | PRN
  Administered 2022-05-26: 60 mg via INTRAVENOUS

## 2022-05-26 MED ORDER — BUPIVACAINE HCL (PF) 0.5 % IJ SOLN
INTRAMUSCULAR | Status: AC
  Filled 2022-05-26: qty 10

## 2022-05-26 MED ORDER — EPINEPHRINE PF 1 MG/ML IJ SOLN
INTRAMUSCULAR | Status: AC
  Filled 2022-05-26: qty 4

## 2022-05-26 MED ORDER — MIDAZOLAM HCL 2 MG/2ML IJ SOLN
1.0000 mg | INTRAMUSCULAR | Status: AC | PRN
  Administered 2022-05-26: 1 mg via INTRAVENOUS

## 2022-05-26 MED ORDER — ACETAMINOPHEN 500 MG PO TABS: 1000.0000 mg | ORAL_TABLET | Freq: Three times a day (TID) | ORAL | 2 refills | Status: AC

## 2022-05-26 MED ORDER — CEFAZOLIN SODIUM-DEXTROSE 2-4 GM/100ML-% IV SOLN
2.0000 g | INTRAVENOUS | Status: AC
  Administered 2022-05-26: 2 g via INTRAVENOUS

## 2022-05-26 MED ORDER — FENTANYL CITRATE (PF) 100 MCG/2ML IJ SOLN
INTRAMUSCULAR | Status: AC
  Filled 2022-05-26: qty 2

## 2022-05-26 MED ORDER — LACTATED RINGERS IV SOLN
INTRAVENOUS | Status: DC | PRN
  Administered 2022-05-26: 12000 mL

## 2022-05-26 MED ORDER — EPHEDRINE SULFATE (PRESSORS) 50 MG/ML IJ SOLN
INTRAMUSCULAR | Status: DC | PRN
  Administered 2022-05-26: 5 mg via INTRAVENOUS

## 2022-05-26 MED ORDER — CHLORHEXIDINE GLUCONATE 0.12 % MT SOLN
15.0000 mL | Freq: Once | OROMUCOSAL | Status: AC
  Administered 2022-05-26: 15 mL via OROMUCOSAL

## 2022-05-26 MED ORDER — FENTANYL CITRATE PF 50 MCG/ML IJ SOSY
PREFILLED_SYRINGE | INTRAMUSCULAR | Status: AC
  Administered 2022-05-26: 50 ug via INTRAVENOUS
  Filled 2022-05-26: qty 1

## 2022-05-26 MED ORDER — FENTANYL CITRATE (PF) 100 MCG/2ML IJ SOLN
INTRAMUSCULAR | Status: DC | PRN
  Administered 2022-05-26: 50 ug via INTRAVENOUS
  Administered 2022-05-26 (×2): 25 ug via INTRAVENOUS

## 2022-05-26 MED ORDER — PHENYLEPHRINE HCL-NACL 20-0.9 MG/250ML-% IV SOLN
INTRAVENOUS | Status: AC
  Filled 2022-05-26: qty 250

## 2022-05-26 MED ORDER — FENTANYL CITRATE PF 50 MCG/ML IJ SOSY: 50.0000 ug | PREFILLED_SYRINGE | Freq: Once | INTRAMUSCULAR | Status: AC

## 2022-05-26 MED ORDER — ONDANSETRON 4 MG PO TBDP: 4.0000 mg | ORAL_TABLET | Freq: Three times a day (TID) | ORAL | 0 refills | Status: DC | PRN

## 2022-05-26 MED ORDER — ORAL CARE MOUTH RINSE: 15.0000 mL | Freq: Once | OROMUCOSAL | Status: AC

## 2022-05-26 MED ORDER — ACETAMINOPHEN 10 MG/ML IV SOLN
INTRAVENOUS | Status: DC | PRN
  Administered 2022-05-26: 1000 mg via INTRAVENOUS

## 2022-05-26 MED ORDER — CHLORHEXIDINE GLUCONATE 0.12 % MT SOLN
OROMUCOSAL | Status: AC
  Filled 2022-05-26: qty 15

## 2022-05-26 MED ORDER — PROPOFOL 10 MG/ML IV BOLUS
INTRAVENOUS | Status: DC | PRN
  Administered 2022-05-26: 150 mg via INTRAVENOUS

## 2022-05-26 MED ORDER — MIDAZOLAM HCL 2 MG/2ML IJ SOLN
INTRAMUSCULAR | Status: AC
  Administered 2022-05-26: 1 mg via INTRAVENOUS
  Filled 2022-05-26: qty 2

## 2022-05-26 MED ORDER — ACETAMINOPHEN 10 MG/ML IV SOLN
INTRAVENOUS | Status: AC
  Filled 2022-05-26: qty 100

## 2022-05-26 MED ORDER — SUGAMMADEX SODIUM 200 MG/2ML IV SOLN
INTRAVENOUS | Status: DC | PRN
  Administered 2022-05-26: 200 mg via INTRAVENOUS

## 2022-05-26 MED ORDER — DEXAMETHASONE SODIUM PHOSPHATE 10 MG/ML IJ SOLN
INTRAMUSCULAR | Status: DC | PRN
  Administered 2022-05-26: 10 mg via INTRAVENOUS

## 2022-05-26 MED ORDER — CEFAZOLIN SODIUM-DEXTROSE 2-4 GM/100ML-% IV SOLN
INTRAVENOUS | Status: AC
  Filled 2022-05-26: qty 100

## 2022-05-26 MED ORDER — LACTATED RINGERS IV SOLN: INTRAVENOUS | Status: DC

## 2022-05-26 MED ORDER — OXYCODONE HCL 5 MG PO TABS: 5.0000 mg | ORAL_TABLET | ORAL | 0 refills | Status: DC | PRN

## 2022-05-26 MED ORDER — BUPIVACAINE LIPOSOME 1.3 % IJ SUSP
INTRAMUSCULAR | Status: DC | PRN
  Administered 2022-05-26: 20 mL

## 2022-05-26 MED ORDER — ONDANSETRON HCL 4 MG/2ML IJ SOLN
INTRAMUSCULAR | Status: DC | PRN
  Administered 2022-05-26: 4 mg via INTRAVENOUS

## 2022-05-26 MED ORDER — BUPIVACAINE LIPOSOME 1.3 % IJ SUSP
INTRAMUSCULAR | Status: AC
  Filled 2022-05-26: qty 20

## 2022-05-26 MED ORDER — BUPIVACAINE HCL (PF) 0.5 % IJ SOLN
INTRAMUSCULAR | Status: DC | PRN
  Administered 2022-05-26: 10 mL

## 2022-05-26 SURGICAL SUPPLY — 56 items
ANCHOR SUT BIOCOMP LK 2.9X12.5 (Anchor) IMPLANT
ANCHOR SWIVELOCK BIO 4.75X19.1 (Anchor) IMPLANT
BLADE SHAVER 4.5X7 STR FR (MISCELLANEOUS) ×1 IMPLANT
BUR BR 5.5 WIDE MOUTH (BURR) ×1 IMPLANT
CANNULA PART THRD DISP 5.75X7 (CANNULA) ×1 IMPLANT
CANNULA PARTIAL THREAD 2X7 (CANNULA) ×1 IMPLANT
CANNULA TWIST IN 8.25X7CM (CANNULA) IMPLANT
CHLORAPREP W/TINT 26 (MISCELLANEOUS) ×1 IMPLANT
COOLER POLAR GLACIER W/PUMP (MISCELLANEOUS) ×1 IMPLANT
COVER LIGHT HANDLE UNIVERSAL (MISCELLANEOUS) ×2 IMPLANT
DERMABOND ADVANCED .7 DNX12 (GAUZE/BANDAGES/DRESSINGS) ×1 IMPLANT
DEVICE SUCT BLK HOLE OR FLOOR (MISCELLANEOUS) IMPLANT
DRAPE INCISE IOBAN 66X45 STRL (DRAPES) ×1 IMPLANT
DRAPE U-SHAPE 48X52 POLY STRL (PACKS) ×1 IMPLANT
DRSG TEGADERM 4X4.75 (GAUZE/BANDAGES/DRESSINGS) ×3 IMPLANT
ELECT REM PT RETURN 9FT ADLT (ELECTROSURGICAL) ×1
ELECTRODE REM PT RTRN 9FT ADLT (ELECTROSURGICAL) ×1 IMPLANT
GAUZE SPONGE 4X4 12PLY STRL (GAUZE/BANDAGES/DRESSINGS) ×1 IMPLANT
GAUZE XEROFORM 1X8 LF (GAUZE/BANDAGES/DRESSINGS) ×1 IMPLANT
GLOVE SRG 8 PF TXTR STRL LF DI (GLOVE) ×3 IMPLANT
GLOVE SURG ENC MOIS LTX SZ7.5 (GLOVE) ×5 IMPLANT
GLOVE SURG UNDER POLY LF SZ8 (GLOVE) ×3
GOWN STRL REIN 2XL XLG LVL4 (GOWN DISPOSABLE) ×1 IMPLANT
GOWN STRL REUS W/ TWL LRG LVL3 (GOWN DISPOSABLE) ×3 IMPLANT
GOWN STRL REUS W/TWL LRG LVL3 (GOWN DISPOSABLE) ×3
IV LACTATED RINGER IRRG 3000ML (IV SOLUTION) ×6
IV LR IRRIG 3000ML ARTHROMATIC (IV SOLUTION) ×6 IMPLANT
KIT INSERTION 2.9 PUSHLOCK (KITS) IMPLANT
KIT STABILIZATION SHOULDER (MISCELLANEOUS) ×1 IMPLANT
KIT TURNOVER KIT A (KITS) ×1 IMPLANT
MANIFOLD 4PT FOR NEPTUNE1 (MISCELLANEOUS) ×1 IMPLANT
MASK FACE SPIDER DISP (MASK) ×1 IMPLANT
MAT ABSORB  FLUID 56X50 GRAY (MISCELLANEOUS) ×2
MAT ABSORB FLUID 56X50 GRAY (MISCELLANEOUS) ×2 IMPLANT
PACK ARTHROSCOPY SHOULDER (MISCELLANEOUS) ×1 IMPLANT
PAD ABD DERMACEA PRESS 5X9 (GAUZE/BANDAGES/DRESSINGS) ×2 IMPLANT
PAD WRAPON POLAR SHDR XLG (MISCELLANEOUS) ×1 IMPLANT
SET Y ADAPTER MULIT-BAG IRRIG (MISCELLANEOUS) ×2 IMPLANT
SPONGE T-LAP 18X18 ~~LOC~~+RFID (SPONGE) IMPLANT
SUT ETHILON 3-0 (SUTURE) ×1 IMPLANT
SUT MNCRL 4-0 (SUTURE) ×1
SUT MNCRL 4-0 27XMFL (SUTURE) ×1
SUT PROLENE 0 CT 2 (SUTURE) ×1 IMPLANT
SUT VIC AB 0 CT1 36 (SUTURE) ×1 IMPLANT
SUT VIC AB 2-0 CT2 27 (SUTURE) ×1 IMPLANT
SUT XBRAID 1.4 BLUE (SUTURE) IMPLANT
SUT XBRAID 1.4 WHITE/BLUE (SUTURE) IMPLANT
SUTURE MNCRL 4-0 27XMF (SUTURE) ×1 IMPLANT
SUTURE TAPE 1.3 40 TPR END (SUTURE) IMPLANT
SUTURETAPE 1.3 40 TPR END (SUTURE) ×1
TAPE MICROFOAM 4IN (TAPE) ×1 IMPLANT
TUBING CONNECTING 10 (TUBING) ×1 IMPLANT
TUBING INFLOW SET DBFLO PUMP (TUBING) ×1 IMPLANT
TUBING OUTFLOW SET DBLFO PUMP (TUBING) ×1 IMPLANT
WAND WEREWOLF FLOW 90D (MISCELLANEOUS) ×1 IMPLANT
WRAPON POLAR PAD SHDR XLG (MISCELLANEOUS) ×1

## 2022-05-26 NOTE — H&P (Addendum)
Paper H&P to be scanned into permanent record. H&P reviewed. No significant changes noted. Discussed with Anesthesia team. No further testing needed given recent Cardiology visit on 04/26/22 (no further recommendations prior to surgery at that visit).

## 2022-05-26 NOTE — Anesthesia Preprocedure Evaluation (Addendum)
Anesthesia Evaluation  Patient identified by MRN, date of birth, ID band Patient awake    Reviewed: Allergy & Precautions, NPO status , Patient's Chart, lab work & pertinent test results  History of Anesthesia Complications Negative for: history of anesthetic complications  Airway Mallampati: III  TM Distance: >3 FB Neck ROM: full    Dental  (+) Dental Advidsory Given, Chipped, Poor Dentition   Pulmonary neg shortness of breath, sleep apnea and Continuous Positive Airway Pressure Ventilation , neg COPD   Pulmonary exam normal        Cardiovascular (-) angina + CAD and + Past MI  (-) CHF + dysrhythmias Atrial Fibrillation + Valvular Problems/Murmurs AS      Neuro/Psych negative neurological ROS  negative psych ROS   GI/Hepatic Neg liver ROS,GERD  ,,  Endo/Other  negative endocrine ROS    Renal/GU      Musculoskeletal   Abdominal   Peds  Hematology negative hematology ROS (+)   Anesthesia Other Findings Past Medical History: No date: Bladder cancer (HCC)     Comment:  3 rounds of chemo instilled in bladder No date: Coronary artery disease     Comment:  Heart cath X3, 1 stent, aortic heart valve replacements               X3 No date: History of atrial fibrillation     Comment:  Ablation X 2, Loop monitor implanted on left side of               chest No date: History of occasional ear infections No date: Sleep apnea     Comment:  CPAP  Past Surgical History: 06/2020: BLADDER SURGERY     Comment:  cancer No date: CARDIAC SURGERY     Comment:  aortic valve replaced 3 times No date: TONSILLECTOMY     Comment:  As a child  BMI    Body Mass Index: 28.89 kg/m      Reproductive/Obstetrics negative OB ROS                             Anesthesia Physical Anesthesia Plan  ASA: 3  Anesthesia Plan: General ETT   Post-op Pain Management: Regional block*   Induction:  Intravenous  PONV Risk Score and Plan: 2 and Ondansetron, Dexamethasone, Midazolam and Treatment may vary due to age or medical condition  Airway Management Planned: Oral ETT  Additional Equipment:   Intra-op Plan:   Post-operative Plan: Extubation in OR  Informed Consent: I have reviewed the patients History and Physical, chart, labs and discussed the procedure including the risks, benefits and alternatives for the proposed anesthesia with the patient or authorized representative who has indicated his/her understanding and acceptance.     Dental Advisory Given  Plan Discussed with: Anesthesiologist, CRNA and Surgeon  Anesthesia Plan Comments: (Patient consented for risks of anesthesia including but not limited to:  - adverse reactions to medications - damage to eyes, teeth, lips or other oral mucosa - nerve damage due to positioning  - sore throat or hoarseness - Damage to heart, brain, nerves, lungs, other parts of body or loss of life  Patient voiced understanding.)         Anesthesia Quick Evaluation

## 2022-05-26 NOTE — Discharge Instructions (Addendum)
Post-Op Instructions - Rotator Cuff Repair  1. Bracing: You will wear a shoulder immobilizer or sling for 6 weeks.   2. Driving: No driving for 3 weeks post-op. When driving, do not wear the immobilizer. Ideally, we recommend no driving for 6 weeks while sling is in place as one arm will be immobilized.   3. Activity: No active lifting for 2 months. Wrist, hand, and elbow motion only. Avoid lifting the upper arm away from the body except for hygiene. You are permitted to bend and straighten the elbow passively only (no active elbow motion). You may use your hand and wrist for typing, writing, and managing utensils (cutting food). Do not lift more than a coffee cup for 8 weeks.  When sleeping or resting, inclined positions (recliner chair or wedge pillow) and a pillow under the forearm for support may provide better comfort for up to 4 weeks.  Avoid long distance travel for 4 weeks.  Return to normal activities after rotator cuff repair repair normally takes 6 months on average. If rehab goes very well, may be able to do most activities at 4 months, except overhead or contact sports.  4. Physical Therapy: Begins 3-4 days after surgery, and proceed 1 time per week for the first 6 weeks, then 1-2 times per week from weeks 6-20 post-op.  5. Medications:  - You will be provided a prescription for narcotic pain medicine. After surgery, take 1-2 narcotic tablets every 4 hours if needed for severe pain.  - A prescription for anti-nausea medication will be provided in case the narcotic medicine causes nausea - take 1 tablet every 6 hours only if nauseated.   - Take tylenol 1000 mg (2 Extra Strength tablets or 3 regular strength) every 8 hours for pain.  May decrease or stop tylenol 5 days after surgery if you are having minimal pain. - Resume normal dosing of Eliquis the day after surgery.    If you are taking prescription medication for anxiety, depression, insomnia, muscle spasm, chronic pain, or for  attention deficit disorder, you are advised that you are at a higher risk of adverse effects with use of narcotics post-op, including narcotic addiction/dependence, depressed breathing, death. If you use non-prescribed substances: alcohol, marijuana, cocaine, heroin, methamphetamines, etc., you are at a higher risk of adverse effects with use of narcotics post-op, including narcotic addiction/dependence, depressed breathing, death. You are advised that taking > 50 morphine milligram equivalents (MME) of narcotic pain medication per day results in twice the risk of overdose or death. For your prescription provided: oxycodone 5 mg - taking more than 6 tablets per day would result in > 50 morphine milligram equivalents (MME) of narcotic pain medication. Be advised that we will prescribe narcotics short-term, for acute post-operative pain only - 3 weeks for major operations such as shoulder repair/reconstruction surgeries.     6. Post-Op Appointment:  Your first post-op appointment will be 10-14 days post-op.  7. Work or School: For most, but not all procedures, we advise staying out of work or school for at least 1 to 2 weeks in order to recover from the stress of surgery and to allow time for healing.   If you need a work or school note this can be provided.   8. Smoking: If you are a smoker, you need to refrain from smoking in the postoperative period. The nicotine in cigarettes will inhibit healing of your shoulder repair and decrease the chance of successful repair. Similarly, nicotine containing products (gum, patches)  should be avoided.   Post-operative Brace: Apply and remove the brace you received as you were instructed to at the time of fitting and as described in detail as the brace's instructions for use indicate.  Wear the brace for the period of time prescribed by your physician.  The brace can be cleaned with soap and water and allowed to air dry only.  Should the brace result in  increased pain, decreased feeling (numbness/tingling), increased swelling or an overall worsening of your medical condition, please contact your doctor immediately.  If an emergency situation occurs as a result of wearing the brace after normal business hours, please dial 911 and seek immediate medical attention.  Let your doctor know if you have any further questions about the brace issued to you. Refer to the shoulder sling instructions for use if you have any questions regarding the correct fit of your shoulder sling.  [place] for Troubleshooting: [phone]  Video that illustrates how to properly use a shoulder sling: "Instructions for Proper Use of an Orthopaedic Sling" ShoppingLesson.[email or web address]   AMBULATORY SURGERY  DISCHARGE INSTRUCTIONS   The drugs that you were given will stay in your system until tomorrow so for the next 24 hours you should not:  Drive an automobile Make any legal decisions Drink any alcoholic beverage   You may resume regular meals tomorrow.  Today it is better to start with liquids and gradually work up to solid foods.  You may eat anything you prefer, but it is better to start with liquids, then soup and crackers, and gradually work up to solid foods.   Please notify your doctor immediately if you have any unusual bleeding, trouble breathing, redness and pain at the surgery site, drainage, fever, or pain not relieved by medication.    Additional Instructions:   PLEASE LEAVE GREEN ARMBAND ON FOR 4 DAYS   Please contact your physician with any problems or Same Day Surgery at [phone], Monday through Friday 6 am to 4 pm, or [place] [facility] number at [phone].   POLAR CARE INFORMATION  [email or web address].[email or web address]  How to use Standard Cold Therapy System?  YouTube   [email or web address]  OPERATING INSTRUCTIONS  Start the product With dry hands, connect the transformer to  the electrical connection located on the top of the cooler. Next, plug the transformer into an appropriate electrical outlet. The unit will automatically start running at this point.  To stop the pump, disconnect electrical power.  Unplug to stop the product when not in use. Unplugging the Polar Care unit turns it off. Always unplug immediately after use. Never leave it plugged in while unattended. Remove pad.    FIRST ADD WATER TO FILL LINE, THEN ICE---Replace ice when existing ice is almost melted  1 Discuss Treatment with your [place] Practitioner and Use Only as Prescribed 2 Apply Insulation Barrier & Cold Therapy Pad 3 Check for Moisture 4 Inspect Skin Regularly  Tips and Trouble Shooting Usage Tips 1. Use cubed or chunked ice for optimal performance. 2. It is recommended to drain the Pad between uses. To drain the pad, hold the Pad upright with the hose pointed toward the ground. Depress the black plunger and allow water to drain out. 3. You may disconnect the Pad from the unit without removing the pad from the affected area by depressing the silver tabs on the hose coupling and gently pulling the hoses apart. The Pad and unit will seal itself and will not  leak. Note: Some dripping during release is normal. 4. DO NOT RUN PUMP WITHOUT WATER! The pump in this unit is designed to run with water. Running the unit without water will cause permanent damage to the pump. 5. Unplug unit before removing lid.  TROUBLESHOOTING GUIDE Pump not running, Water not flowing to the pad, Pad is not getting cold 1. Make sure the transformer is plugged into the wall outlet. 2. Confirm that the ice and water are filled to the indicated levels. 3. Make sure there are no kinks in the pad. 4. Gently pull on the blue tube to make sure the tube/pad junction is straight. 5. Remove the pad from the treatment site and ll it while the pad is lying at; then reapply. 6. Confirm that the pad couplings are  securely attached to the unit. Listen for the double clicks (Figure 1) to confirm the pad couplings are securely attached.  Leaks    Note: Some condensation on the lines, controller, and pads is unavoidable, especially in warmer climates. 1. If using a Breg Polar Care Cold Therapy unit with a detachable Cold Therapy Pad, and a leak exists (other than condensation on the lines) disconnect the pad couplings. Make sure the silver tabs on the couplings are depressed before reconnecting the pad to the pump hose; then confirm both sides of the coupling are properly clicked in. 2. If the coupling continues to leak or a leak is detected in the pad itself, stop using it and call [place] at [phone].  Cleaning After use, empty and dry the unit with a soft cloth. Warm water and mild detergent may be used occasionally to clean the pump and tubes.  WARNING: The [place] can be cold enough to cause serious injury, including full skin necrosis. Follow these Operating Instructions, and carefully read the Product Insert (see pouch on side of unit) and the Cold Therapy Pad Fitting Instructions (provided with each Cold Therapy Pad) prior to use.   SHOULDER SLING IMMOBILIZER   VIDEO Slingshot 2 Shoulder Brace Application - YouTube ---[email or web address]-schwartz.[email or web address]/  INSTRUCTIONS While supporting the injured arm, slide the forearm into the sling. Wrap the adjustable shoulder strap around the neck and shoulders and attach the strap end to the sling using  the "alligator strap tab."  Adjust the shoulder strap to the required length. Position the shoulder pad behind the neck. To secure the shoulder pad location (optional), pull the shoulder strap away from the shoulder pad, unfold the hook material on the top of the pad, then press the shoulder strap back onto the hook material to secure the pad in place. Attach the closure strap across the open top of the sling. Position the strap  so that it holds the arm securely in the sling. Next, attach the thumb strap to the open end of the sling between the thumb and fingers. After sling has been fit, it may be easily removed and reapplied using the quick release buckle on shoulder strap. If a neutral pillow or 15 abduction pillow is included, place the pillow at the waistline. Attach the sling to the pillow, lining up hook material on the pillow with the loop on sling. Adjust the waist strap to fit.  If waist strap is too long, cut it to fit. Use the small piece of double sided hook material (located on top of the pillow) to secure the strap end. Place the double sided hook material on the inside of the cut strap end and  secure it to the waist strap.     If no pillow is included, attach the waist strap to the sling and adjust to fit.    Washing Instructions: Straps and sling must be removed and cleaned regularly depending on your activity level and perspiration. Hand wash straps and sling in cold water with mild detergent, rinse, air dry        Interscalene Nerve Block with Exparel   For your surgery you have received an Interscalene Nerve Block with Exparel. Nerve Blocks affect many types of nerves, including nerves that control movement, pain and normal sensation.  You may experience feelings such as numbness, tingling, heaviness, weakness or the inability to move your arm or the feeling or sensation that your arm has "fallen asleep". A nerve block with Exparel can last up to 5 days.  Usually the weakness wears off first.  The tingling and heaviness usually wear off next.  Finally you may start to notice pain.  Keep in mind that this may occur in any order.  Once a nerve block starts to wear off it is usually completely gone within 60 minutes. ISNB may cause mild shortness of breath, a hoarse voice, blurry vision, unequal pupils, or drooping of the face on the same side as the nerve block.  These symptoms will usually resolve with  the numbness.  Very rarely the procedure itself can cause mild seizures. If needed, your surgeon will give you a prescription for pain medication.  It will take about 60 minutes for the oral pain medication to become fully effective.  So, it is recommended that you start taking this medication before the nerve block first begins to wear off, or when you first begin to feel discomfort. Take your pain medication only as prescribed.  Pain medication can cause sedation and decrease your breathing if you take more than you need for the level of pain that you have. Nausea is a common side effect of many pain medications.  You may want to eat something before taking your pain medicine to prevent nausea. After an Interscalene nerve block, you cannot feel pain, pressure or extremes in temperature in the effected arm.  Because your arm is numb it is at an increased risk for injury.  To decrease the possibility of injury, please practice the following:  While you are awake change the position of your arm frequently to prevent too much pressure on any one area for prolonged periods of time.  If you have a cast or tight dressing, check the color or your fingers every couple of hours.  Call your surgeon with the appearance of any discoloration (white or blue). If you are given a sling to wear before you go home, please wear it  at all times until the block has completely worn off.  Do not get up at night without your sling. Please contact [facility] or your surgeon if you do not begin to regain sensation after 7 days from the surgery.  Anesthesia may be contacted by calling the Same Day Surgery Department, Mon. through Fri., 6 am to 4 pm at [phone].   If you experience any other problems or concerns, please contact your surgeon's office. If you experience severe or prolonged shortness of breath go to the nearest emergency department.

## 2022-05-26 NOTE — Anesthesia Procedure Notes (Signed)
Procedure Name: Intubation Date/Time: 05/26/2022 10:32 AM  Performed by: Johnna Acosta, CRNAPre-anesthesia Checklist: Patient identified, Emergency Drugs available, Suction available, Patient being monitored and Timeout performed Patient Re-evaluated:Patient Re-evaluated prior to induction Oxygen Delivery Method: Circle system utilized Preoxygenation: Pre-oxygenation with 100% oxygen Induction Type: IV induction Ventilation: Mask ventilation without difficulty Laryngoscope Size: McGraph and 3 Grade View: Grade I Tube type: Oral Tube size: 7.0 mm Number of attempts: 1 Airway Equipment and Method: Stylet and Video-laryngoscopy Placement Confirmation: ETT inserted through vocal cords under direct vision, positive ETCO2 and breath sounds checked- equal and bilateral Secured at: 21 cm Tube secured with: Tape Dental Injury: Teeth and Oropharynx as per pre-operative assessment

## 2022-05-26 NOTE — Anesthesia Procedure Notes (Signed)
Anesthesia Regional Block: Interscalene brachial plexus block   Pre-Anesthetic Checklist: , timeout performed,  Correct Patient, Correct Site, Correct Laterality,  Correct Procedure, Correct Position, site marked,  Risks and benefits discussed,  Surgical consent,  Pre-op evaluation,  At surgeon's request and post-op pain management  Laterality: Right  Prep: chloraprep       Needles:  Injection technique: Single-shot  Needle Type: Echogenic Needle     Needle Length: 4cm  Needle Gauge: 25     Additional Needles:   Procedures:,,,, ultrasound used (permanent image in chart),,    Narrative:  Start time: 05/26/2022 10:08 AM End time: 05/26/2022 10:12 AM Injection made incrementally with aspirations every 5 mL.  Performed by: Personally  Anesthesiologist: Dimas Millin, MD  Additional Notes: Patient's chart reviewed and they were deemed appropriate candidate for procedure, at surgeon's request. Patient educated about risks, benefits, and alternatives of the block including but not limited to: temporary or permanent nerve damage, bleeding, infection, damage to surround tissues, pneumothorax, hemidiaphragmatic paralysis, unilateral Horner's syndrome, block failure, local anesthetic toxicity. Patient expressed understanding. A formal time-out was conducted consistent with institution rules.  Monitors were applied, and minimal sedation used (see nursing record). The site was prepped with skin prep and allowed to dry, and sterile gloves were used. A high frequency linear ultrasound probe with probe cover was utilized throughout. C5-7 nerve roots located and appeared anatomically normal, local anesthetic injected around them, and echogenic block needle trajectory was monitored throughout. Aspiration performed every 7m. Lung and blood vessels were avoided. All injections were performed without resistance and free of blood and paresthesias. The patient tolerated the procedure well.  Injectate:  259mexparel + 1024m.5% bupivacaine

## 2022-05-26 NOTE — Op Note (Signed)
SURGERY DATE: 05/26/2022   PRE-OP DIAGNOSIS:  1. Right proximal biceps rupture 2. Right rotator cuff tear (subscapularis)  POST-OP DIAGNOSIS: 1. Right proximal biceps rupture 2. Right rotator cuff tear (subscapularis) 3. Right glenohumeral joint degenerative changes  PROCEDURES:  1. Right arthroscopic rotator cuff repair (subscapularis) 2. Right arthroscopic extensive debridement of shoulder (glenohumeral and subacromial spaces)  SURGEON: Cato Mulligan, MD   ASSISTANT: Anitra Lauth, PA   ANESTHESIA: Gen with Exparel interscalene block   ESTIMATED BLOOD LOSS: 5cc   DRAINS:  none   TOTAL IV FLUIDS: per anesthesia      SPECIMENS: none   IMPLANTS:  - Arthrex 2.88m PushLock x 1 - Arthrex 4.763mSwiveLock x 1      OPERATIVE FINDINGS:  Examination under anesthesia: A careful examination under anesthesia was performed.  Passive range of motion was: FF: 160; ER at side: 80; ER in abduction: 100; IR in abduction: 40.  Anterior load shift: NT.  Posterior load shift: NT.  Sulcus in neutral: NT.  Sulcus in ER: NT.     Intra-operative findings: A thorough arthroscopic examination of the shoulder was performed.  The findings are: 1. Biceps tendon: Not visualized intra-articularly 2. Superior labrum: erythema 3. Posterior labrum and capsule: Significant synovitis about posterior capsule and labrum 4. Inferior capsule and inferior recess: normal 5. Glenoid cartilage surface: Diffuse grade 2-3 degenerative changes to the glenoid 6. Supraspinatus attachment: Partial-thickness articular sided tear of the supraspinatus affecting approximately 10% of the footprint 7. Posterior rotator cuff attachment: normal 8. Humeral head articular cartilage: Diffuse grade 2-3 degenerative changes 9. Rotator interval: significant synovitis 10: Subscapularis tendon: Interstitial tear with retraction of the intrasubstance and superior border 11. Anterior labrum: Degenerative and synovitic 12. IGHL:  normal   OPERATIVE REPORT:    Indications for procedure:  CuTaren Toopss a [age].o. male with over 1 year of right shoulder pain that began while playing tennis.  He has had difficulty maintaining his desired activity level due to this pain.  He is unable to play tennis currently.  He has attempted extensive nonoperative management including physical therapy, activity modifications, medical management, and corticosteroid injection without appropriate improvement in his symptoms. Clinical exam and MRI were suggestive of rotator cuff tear involving the subscapularis and proximal biceps rupture. After discussion of risks, benefits, and alternatives to surgery, the patient elected to proceed.    Procedure in detail:   I identified CuJarett Drallen the pre-operative holding area.  I marked the operative shoulder with my initials. I reviewed the risks and benefits of the proposed surgical intervention, and the patient wished to proceed.  Anesthesia was then performed with an Exparel interscalene block.  The patient was transferred to the operative suite and placed in the beach chair position.     Appropriate IV antibiotics were administered prior to incision. The operative upper extremity was then prepped and draped in standard fashion. A time out was performed confirming the correct extremity, correct patient, and correct procedure.    I then created a standard posterior portal with an 11 blade. The glenohumeral joint was easily entered with a blunt trocar and the arthroscope introduced. The findings of diagnostic arthroscopy are described above.  I first debrided the slight remnant stump of the proximal biceps tendon at its insertion on the superior labrum.  Next, I debrided degenerative tissue including the synovitic tissue about the rotator interval and anterior and superior labrum. I then coagulated the inflamed synovium to obtain hemostasis and reduce  the risk of post-operative swelling using an  Arthrocare radiofrequency device.  This synovectomy and debridement was also performed about the posterior capsule after switching arthroscopic portals reviewed from the anterior portal and work from the posterior portal.  Camera was returned to the posterior portal.  Undersurface of the supraspinatus was debrided as there was very mild partial tearing of the articular side of the supraspinatus affecting approximately 10% of the footprint width.   The subscapularis tear was identified.  A superior anterolateral portal was made under needle localization.  A cannula was placed.  The comma tissue indicating the superolateral border of the subscapularis was identified readily.  The tip of the coracoid as well as the conjoined tendon and coracoacromial ligaments were visualized after debriding rotator interval tissue.  Tissue about the subscapularis was released anteriorly, superiorly, and posteriorly to allow for improved mobilization.  The lesser tuberosity footprint was prepared with a combination of electrocautery and curette. 2 suture tapes were passed in a mattress fashion.  All 4 strands of suture were then loaded onto a 4.75 mm SwiveLock anchor and placed into the inferior aspect of the prepared footprint with the arm in a neutral position.  This construct appropriately reduced the inferior aspect of the subscapularis tear.  Next, 1 additional suture tape was also passed in a mattress fashion along the superior border of the subscapularis.  The sutures were then passed through an Arthrex PushLock anchor and it was impacted into place with appropriate tension on the sutures along the superior aspect of the prepared footprint.  This allowed for an excellent anatomic repair of the subscapularis.  The arm was then internally and externally rotated and the subscapularis was noted to move appropriately with rotation.  The remainder of the suture was then cut.   Next, the arthroscope was then introduced into the  subacromial space. An extensive subacromial bursectomy and debridement was performed using a combination of the shaver and Arthrocare wand.  The supraspinatus and infraspinatus were probed extensively, and they were noted to be intact on the bursal side without any signs of tearing.    Fluid was evacuated from the shoulder, and the portals were closed with 3-0 Nylon. Xeroform was applied to the portals. A sterile dressing was applied, followed by a Polar Care sleeve and a SlingShot shoulder immobilizer/sling. The patient was awakened from anesthesia without difficulty and was transferred to the PACU in stable condition.    Of note, assistance from a PA was essential to performing the surgery.  PA was present for the entire surgery.  PA assisted with patient positioning, retraction, instrumentation, and wound closure. The surgery would have been more difficult and had longer operative time without PA assistance.   COMPLICATIONS: none   DISPOSITION: plan for discharge home after recovery in PACU     POSTOPERATIVE PLAN: Remain in sling (except hygiene and elbow/wrist/hand RoM exercises as instructed by PT) x 6 weeks and NWB for this time. PT to begin 3-4 days after surgery.  Use large rotator cuff repair rehab protocol with subscapularis restrictions.

## 2022-05-26 NOTE — Transfer of Care (Signed)
Immediate Anesthesia Transfer of Care Note  Patient: William Cook  Procedure(s) Performed: Right shoulder arthroscopic vs mini-open rotator cuff repair, biceps tenodesis, distal clavicle excision, subacromial decompression (Right: Shoulder)  Patient Location: PACU  Anesthesia Type:General  Level of Consciousness: drowsy  Airway & Oxygen Therapy: Patient Spontanous Breathing and Patient connected to face mask oxygen  Post-op Assessment: Report given to RN and Post -op Vital signs reviewed and stable  Post vital signs: Reviewed and stable  Last Vitals:  Vitals Value Taken Time  BP 129/69 05/26/22 1230  Temp 36.6 C 05/26/22 1229  Pulse 47 05/26/22 1232  Resp 18 05/26/22 1232  SpO2 97 % 05/26/22 1232  Vitals shown include unvalidated device data.  Last Pain:  Vitals:   05/26/22 1229  TempSrc:   PainSc: Asleep         Complications: No notable events documented.

## 2022-05-29 NOTE — Anesthesia Postprocedure Evaluation (Signed)
Anesthesia Post Note  Patient: William Cook  Procedure(s) Performed: Right shoulder arthroscopic vs mini-open rotator cuff repair, biceps tenodesis, distal clavicle excision, subacromial decompression (Right: Shoulder)  Patient location during evaluation: PACU Anesthesia Type: General Level of consciousness: awake and alert Pain management: pain level controlled Vital Signs Assessment: post-procedure vital signs reviewed and stable Respiratory status: spontaneous breathing, nonlabored ventilation, respiratory function stable and patient connected to nasal cannula oxygen Cardiovascular status: blood pressure returned to baseline and stable Postop Assessment: no apparent nausea or vomiting Anesthetic complications: no  No notable events documented.   Last Vitals:  Vitals:   05/26/22 1315 05/26/22 1346  BP: 123/71 130/69  Pulse: (!) 47 (!) 51  Resp: 14 16  Temp: 36.4 C 36.6 C  SpO2: 92% 95%    Last Pain:  Vitals:   05/26/22 1346  TempSrc: Temporal  PainSc: 0-No pain                 Dimas Millin

## 2022-05-30 ENCOUNTER — Encounter: Payer: Self-pay | Admitting: Orthopedic Surgery

## 2022-07-03 ENCOUNTER — Ambulatory Visit: Payer: Medicare Other

## 2022-07-10 ENCOUNTER — Ambulatory Visit
Admission: RE | Admit: 2022-07-10 | Discharge: 2022-07-10 | Disposition: A | Discharge status: Home / Self Care | Payer: Medicare Other | Source: Ambulatory Visit | Attending: Urology | Admitting: Urology

## 2022-07-10 DIAGNOSIS — Z8551 Personal history of malignant neoplasm of bladder: Secondary | ICD-10-CM | POA: Insufficient documentation

## 2022-07-10 LAB — POCT I-STAT CREATININE: Creatinine, Ser: 1 mg/dL (ref 0.61–1.24)

## 2022-07-10 MED ORDER — IOHEXOL 300 MG/ML  SOLN
125.0000 mL | Freq: Once | INTRAMUSCULAR | Status: AC | PRN
  Administered 2022-07-10: 125 mL via INTRAVENOUS

## 2022-07-13 ENCOUNTER — Other Ambulatory Visit: Payer: Medicare Other | Admitting: Urology

## 2022-07-17 ENCOUNTER — Other Ambulatory Visit: Payer: Medicare Other | Admitting: Urology

## 2022-07-19 ENCOUNTER — Ambulatory Visit (INDEPENDENT_AMBULATORY_CARE_PROVIDER_SITE_OTHER): Payer: Medicare Other | Admitting: Urology

## 2022-07-19 ENCOUNTER — Other Ambulatory Visit: Payer: Medicare Other | Admitting: Urology

## 2022-07-19 ENCOUNTER — Encounter: Payer: Self-pay | Admitting: Urology

## 2022-07-19 VITALS — BP 146/90 | HR 61 | Ht 68.0 in | Wt 186.0 lb

## 2022-07-19 DIAGNOSIS — Z8551 Personal history of malignant neoplasm of bladder: Secondary | ICD-10-CM

## 2022-07-19 MED ORDER — LIDOCAINE HCL URETHRAL/MUCOSAL 2 % EX GEL
1.0000 | Freq: Once | CUTANEOUS | Status: AC
  Administered 2022-07-19: 1 via URETHRAL

## 2022-07-19 NOTE — Progress Notes (Signed)
Bladder cancer surveillance note   INDICATION History of bladder cancer   UROLOGIC HISTORY William Cook is a 72 y.o. male with history of a 3 cm lateral wall bladder tumor treated with TURBT in February 2022 with Dr. Rosana Hoes at [facility].  This was diagnosed on an ultrasound.   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  Did not undergo second look TURBT.   Treatments for Bladder Cancer 07/2020 TURBT 09/2020 induction BCG x6 01/2021: mBCG 06/2021: mBCG  He had a fair amount of urinary frequency, urgency and persistent urinary symptoms after his BCG from January, and opted to defer further BCG.   Cystoscopy Procedure Note:   After informed consent and discussion of the procedure and its risks, William Cook was positioned and prepped in the standard fashion. Cystoscopy was performed with the a flexible cystoscope. The urethra, bladder neck and entire bladder was visualized in a standard fashion.  Scar left lateral wall, no obvious recurrence. The ureteral orifices were visualized in their normal location and orientation.  No abnormalities on retroflexion.  Mild to moderate bladder trabeculations.  He also has a history of a PSA as high as 6.6, and this dropped down to 5.1 with 18% free, and repeat PSA recently remained stable at 5.0.  We discussed possible etiologies including BPH, prostate cancer, or more likely elevated secondary to multiple BCG treatments and cystoscopy, especially in the settings of his persistent irritative urinary symptoms after most recent BCG.  Most recent PSA continues to decrease, 4.4(19% free) on 03/07/2022  CT urogram for routine surveillance on 07/10/2022 shows no evidence of recurrence, no filling defects, prostate measures 60g(PSAd 0.07).  Reassurance provided regarding low PSA density and decreasing/stable PSA.  Will plan to repeat PSA in about 1 year   RTC 38mo cysto    BNickolas Madrid MD 07/19/2022

## 2023-01-03 IMAGING — MR MR SHOULDER*R* W/O CM
4 of 5 series · 31 of 40 positions shown · non-contrast
Comparison: None.

CLINICAL DATA: Right shoulder pain.  Limited range of motion.

EXAM:
MRI OF THE RIGHT SHOULDER WITHOUT CONTRAST
TECHNIQUE: Multiplanar, multisequence MR imaging of the shoulder was performed.
No intravenous contrast was administered.

[Series 5: T2 fat-sat · axial · right · 4.0mm · 0.44mm/px · z∈[-35,+69]mm · 8 of 26 slices shown (1 of 2)]
[im 1/26]
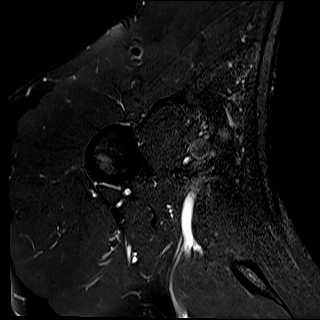
[im 4/26]
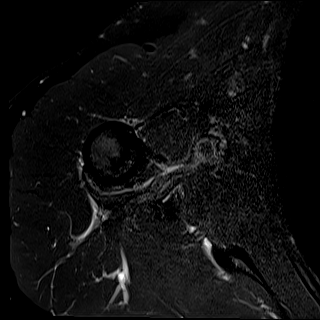
[im 8/26]
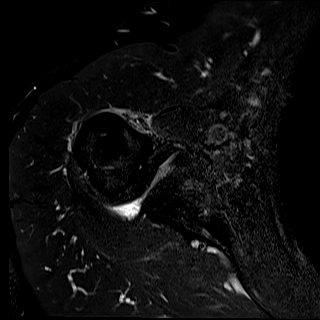
[im 11/26]
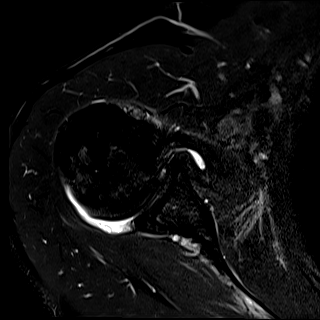
[im 15/26]
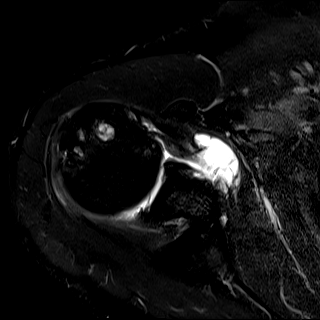
[im 18/26]
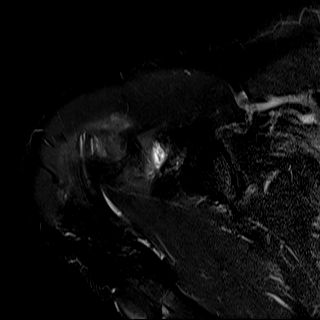
[im 22/26]
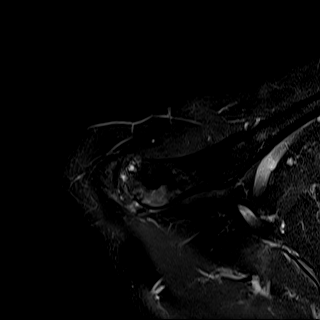
[im 26/26]
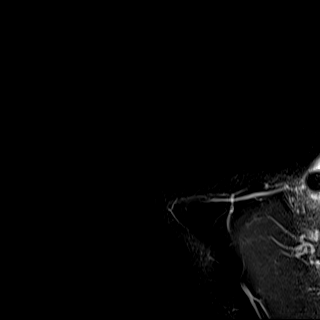

[Series 6: PD · oblique · right · 4.0mm · 0.44mm/px · 9 of 26 slices shown]
[im 1/26]
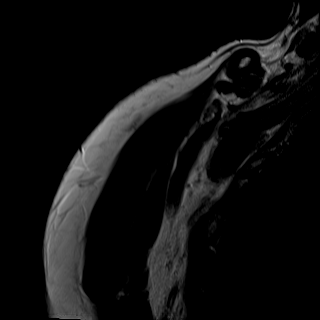
[im 4/26]
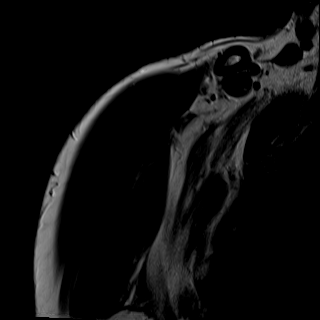
[im 7/26]
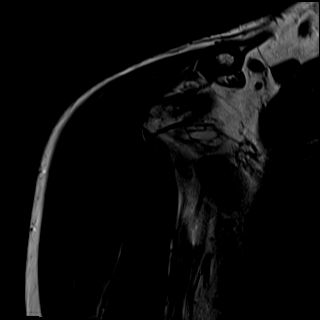
[im 10/26]
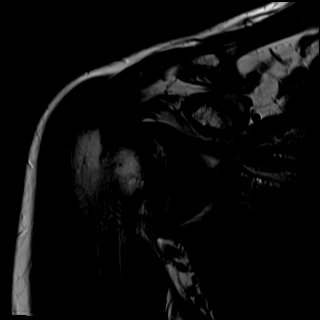
[im 13/26]
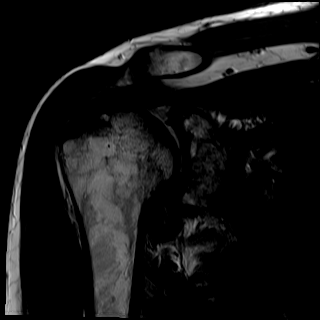
[im 16/26]
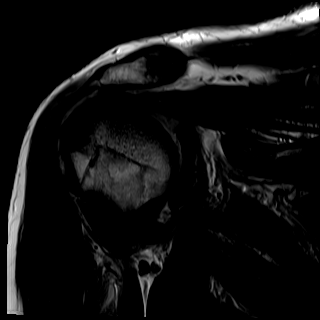
[im 19/26]
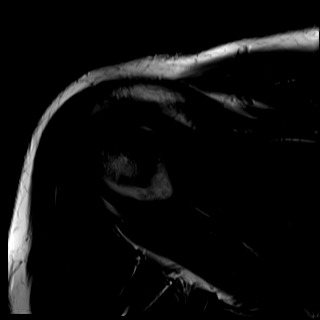
[im 22/26]
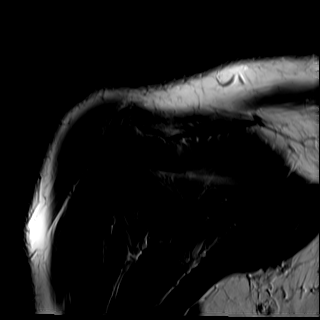
[im 26/26]
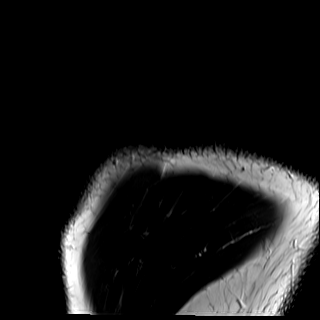

[Series 7: T2 fat-sat · oblique · right · 4.0mm · 0.44mm/px · 9 of 26 slices shown (2 of 2)]
[im 1/26]
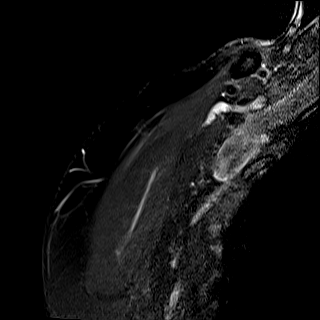
[im 4/26]
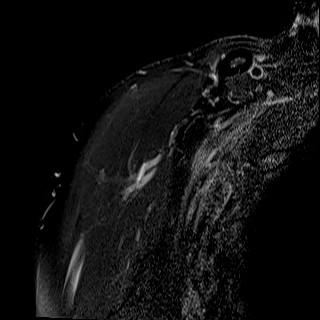
[im 7/26]
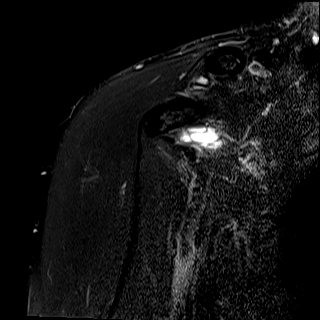
[im 10/26]
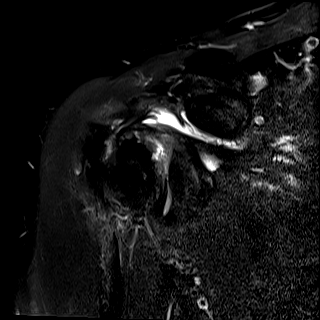
[im 13/26]
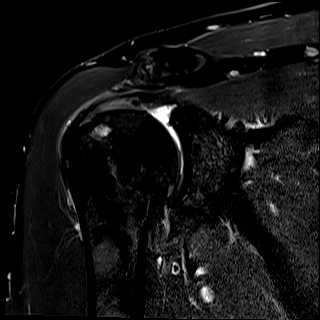
[im 16/26]
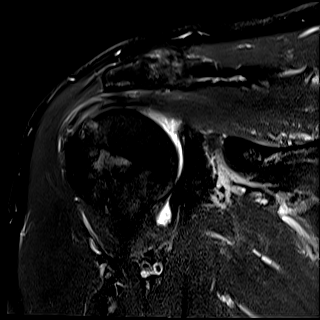
[im 19/26]
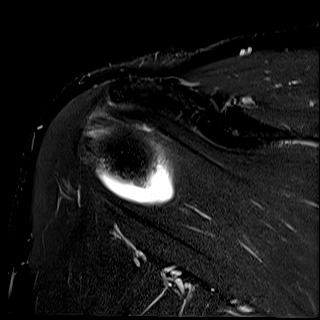
[im 22/26]
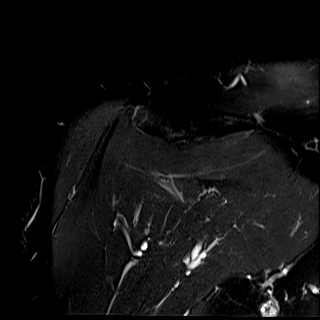
[im 26/26]
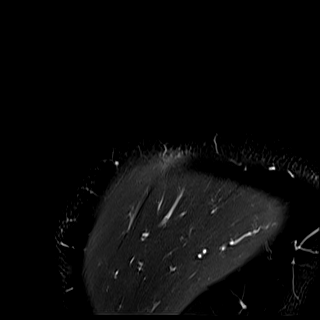

[Series 1012: T2 · sagittal · right · 4.0mm · 0.22mm/px · 5 of 22 slices shown]
[im 1/22]
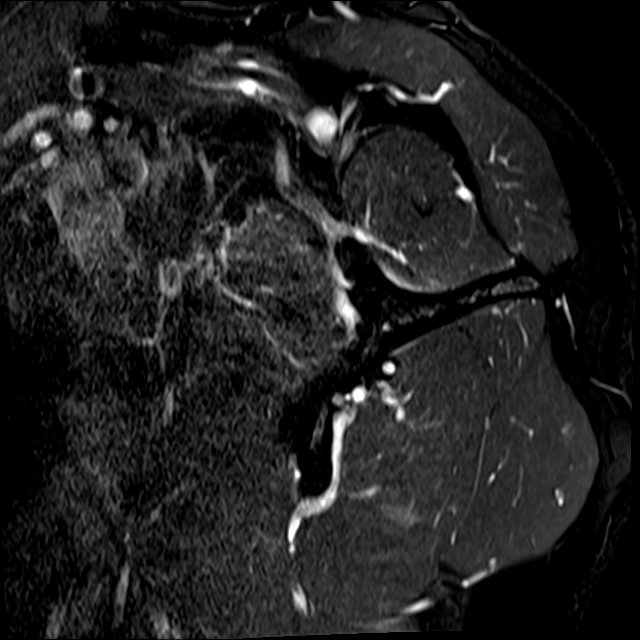
[im 4/22]
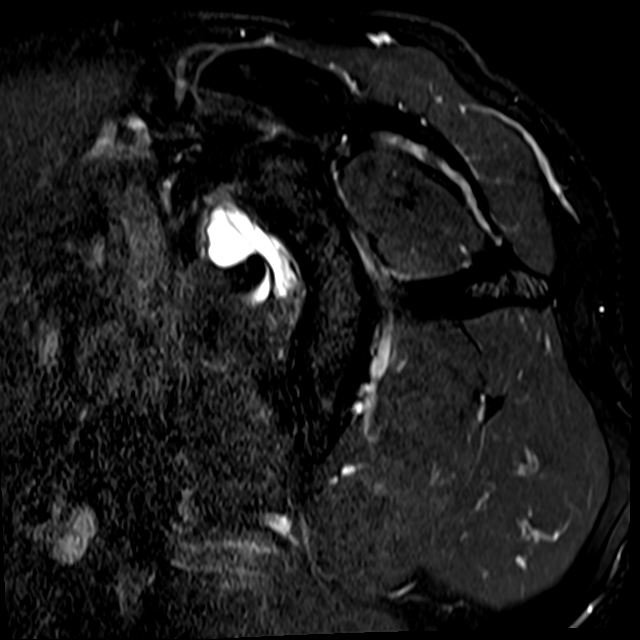
[im 8/22]
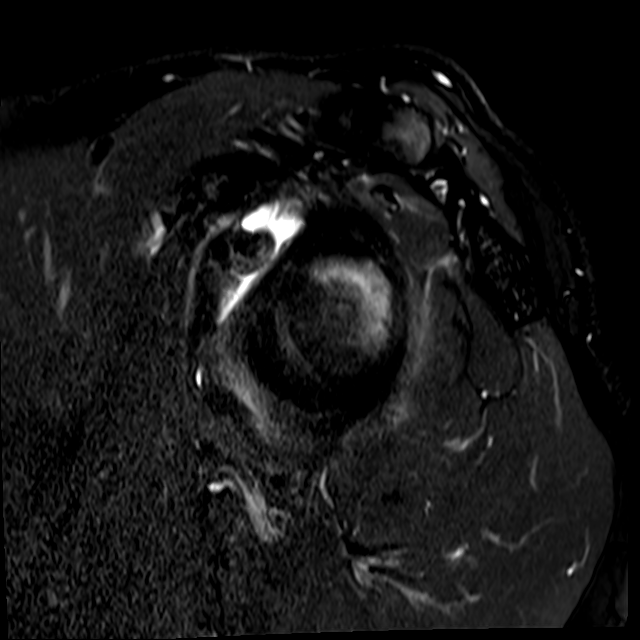
[im 11/22]
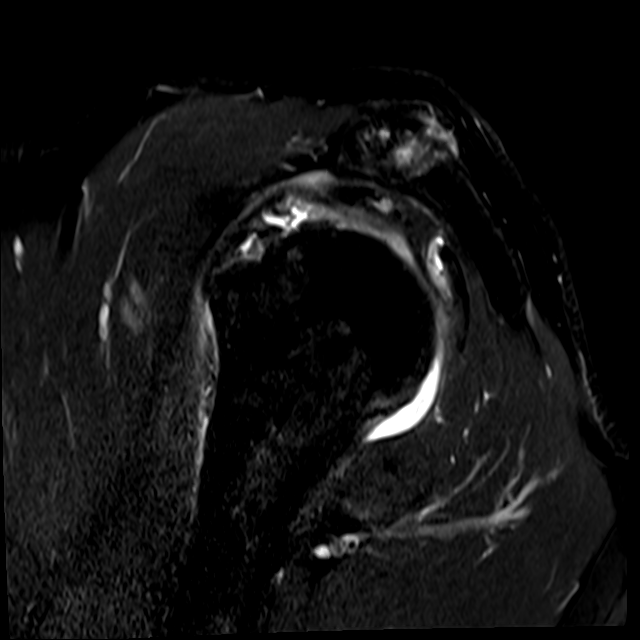
[im 18/22]
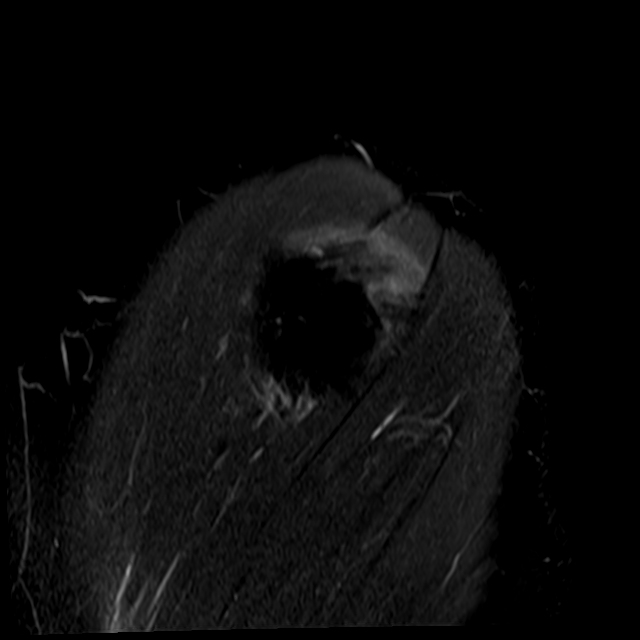

[31 of 40 positions shown; findings below may reference images not displayed]

FINDINGS: Rotator cuff: Moderate subscapularis tendinosis with high-grade
partial-thickness insertional tearing. Moderate supraspinatus and
infraspinatus tendinosis without a well-defined tear. Intact teres
minor. No full-thickness or retracted rotator cuff tear.

Muscles: Preserved bulk and signal intensity of the rotator cuff
musculature without edema, atrophy, or fatty infiltration.

Biceps long head:  Completely torn and retracted.

Acromioclavicular Joint: Mild degenerative changes of the AC joint.
Trace subacromial-subdeltoid bursal fluid.

Glenohumeral Joint: Small glenohumeral joint effusion with
synovitis. Mild diffuse chondral thinning. Inferior glenohumeral
ligament is mildly thickened. Loss of the anatomic fat within the
rotator interval.

Labrum: Superior and posterior labrum is blunted and degenerated. No
paralabral cyst.

Bones: No acute fracture. No dislocation. No bone marrow edema. No
marrow replacing bone lesion.

Other: None.
IMPRESSION: 1. Moderate subscapularis tendinosis with high-grade
partial-thickness insertional tearing.
2. Moderate supraspinatus and infraspinatus tendinosis without a
well-defined tear.
3. Long head biceps tendon is torn and retracted.
4. Mild acromioclavicular and glenohumeral osteoarthritis.
5. Small glenohumeral joint effusion with synovitis.
6. Mildly thickened inferior glenohumeral ligament with loss of the
anatomic fat within the rotator interval, which can be seen in the
clinical setting of adhesive capsulitis.

## 2023-01-24 ENCOUNTER — Other Ambulatory Visit: Payer: Medicare Other | Admitting: Urology

## 2023-02-08 ENCOUNTER — Ambulatory Visit (INDEPENDENT_AMBULATORY_CARE_PROVIDER_SITE_OTHER): Payer: Medicare Other | Admitting: Urology

## 2023-02-08 ENCOUNTER — Encounter: Payer: Self-pay | Admitting: Urology

## 2023-02-08 DIAGNOSIS — Z8551 Personal history of malignant neoplasm of bladder: Secondary | ICD-10-CM

## 2023-02-08 DIAGNOSIS — Z125 Encounter for screening for malignant neoplasm of prostate: Secondary | ICD-10-CM | POA: Diagnosis not present

## 2023-02-08 NOTE — Patient Instructions (Signed)

## 2023-02-08 NOTE — Progress Notes (Signed)
Bladder cancer surveillance note   INDICATION History of bladder cancer   UROLOGIC HISTORY Dago Mom is a 72 y.o. male with history of a 3 cm lateral wall bladder tumor treated with TURBT in February 2022 with Dr. Earlene Plater at [facility].  This was diagnosed on an ultrasound.   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  Did not undergo second look TURBT.   Treatments for Bladder Cancer 07/2020 TURBT 09/2020 induction BCG x6 01/2021: mBCG 06/2021: mBCG  He had a fair amount of urinary frequency, urgency and persistent urinary symptoms after his BCG from January 2023, and opted to defer further BCG.   Cystoscopy Procedure Note:   After informed consent and discussion of the procedure and its risks, Nysean Prats was positioned and prepped in the standard fashion. Cystoscopy was performed with the a flexible cystoscope. The urethra, bladder neck and entire bladder was visualized in a standard fashion.  Scar left lateral wall, no obvious recurrence. The ureteral orifices were visualized in their normal location and orientation.  No abnormalities on retroflexion.  Mild to moderate bladder trabeculations.  He also has a history of a PSA as high as 6.6, and this dropped down to 5.1 with 18% free, and repeat PSA recently remained stable at 5.0.  We discussed possible etiologies including BPH, prostate cancer, or more likely elevated secondary to multiple BCG treatments and cystoscopy, especially in the settings of his persistent irritative urinary symptoms after most recent BCG.  Most recent PSA continues to decrease, 4.4(19% free) on 03/07/2022.  Risks and benefits of PSA screening were discussed, as well as his reassuring PSA density of 0.07 with 60 g prostate on CT from January 2024.  He will have PSA checked with PCP this fall.  He also has some obstructive urinary symptoms, did not tolerate Flomax well in the  past.  Not bothered enough to consider surgical intervention at this time, but we briefly reviewed differences between UroLift and HOLEP, and I do think he would likely benefit from HOLEP in the future if urinary symptoms worsen.   RTC 27mo cysto    Legrand Rams, MD 02/08/2023

## 2023-08-09 ENCOUNTER — Other Ambulatory Visit: Payer: Medicare Other | Admitting: Urology

## 2023-08-16 ENCOUNTER — Ambulatory Visit: Payer: Medicare Other | Admitting: Urology

## 2023-08-16 ENCOUNTER — Other Ambulatory Visit: Payer: Self-pay | Admitting: Urology

## 2023-08-16 VITALS — BP 141/77 | HR 65 | Wt 187.0 lb

## 2023-08-16 DIAGNOSIS — Z8551 Personal history of malignant neoplasm of bladder: Secondary | ICD-10-CM | POA: Diagnosis not present

## 2023-08-16 DIAGNOSIS — Z87898 Personal history of other specified conditions: Secondary | ICD-10-CM

## 2023-08-16 MED ORDER — LIDOCAINE HCL URETHRAL/MUCOSAL 2 % EX GEL
1.0000 | Freq: Once | CUTANEOUS | Status: AC
Start: 2023-08-16 — End: 2023-08-16
  Administered 2023-08-16: 1 via URETHRAL

## 2023-08-16 NOTE — Progress Notes (Signed)
Bladder cancer surveillance note   INDICATION History of bladder cancer   UROLOGIC HISTORY William Cook is a 73 y.o. male with history of a 3 cm lateral wall bladder tumor treated with TURBT in February 2022 with Dr. Earlene Plater at [facility].  This was diagnosed on an ultrasound.   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  Did not undergo second look TURBT.   Treatments for Bladder Cancer 07/2020 TURBT 09/2020 induction BCG x6 01/2021: mBCG 06/2021: mBCG  He had a fair amount of urinary frequency, urgency and persistent urinary symptoms after his BCG from January 2023, and opted to defer further BCG.   Cystoscopy Procedure Note:   After informed consent and discussion of the procedure and its risks, William Cook was positioned and prepped in the standard fashion. Cystoscopy was performed with the a flexible cystoscope. The urethra, bladder neck and entire bladder was visualized in a standard fashion.  Scar left lateral wall, no obvious recurrence. The ureteral orifices were visualized in their normal location and orientation.  No abnormalities on retroflexion.  Mild to moderate bladder trabeculations.  He also has a history of a PSA as high as 6.6, and this dropped down to 5.1 with 18% free, and repeat PSA recently remained stable at 5.0.  We discussed possible etiologies including BPH, prostate cancer, or more likely elevated secondary to multiple BCG treatments and cystoscopy, especially in the settings of his persistent irritative urinary symptoms after most recent BCG.  Most recent PSA continues to decrease, 4.4(19% free) on 03/07/2022.  Risks and benefits of PSA screening were discussed, as well as his reassuring PSA density of 0.07 with 60 g prostate on CT from January 2024.    He also has some obstructive urinary symptoms, did not tolerate Flomax well in the past.  Not bothered enough to consider other  medications like alfuzosin or finasteride or surgical intervention at this time.  We previously had discussed HOLEP, he may be interested this in the future  RTC 35mo cysto, space to yearly starting 07/2024 PSA reflex to free prior to next visit  Legrand Rams, MD 08/16/2023

## 2023-08-16 NOTE — Addendum Note (Signed)
Addended by: Frankey Shown on: 08/16/2023 02:16 PM   Modules accepted: Orders

## 2023-09-04 ENCOUNTER — Other Ambulatory Visit: Payer: Medicare Other | Admitting: Urology

## 2023-10-04 ENCOUNTER — Other Ambulatory Visit: Payer: Medicare Other | Admitting: Urology

## 2023-10-22 ENCOUNTER — Encounter: Admitting: Family Medicine

## 2023-10-29 ENCOUNTER — Ambulatory Visit (INDEPENDENT_AMBULATORY_CARE_PROVIDER_SITE_OTHER): Admitting: Family Medicine

## 2023-10-29 ENCOUNTER — Encounter: Payer: Self-pay | Admitting: Family Medicine

## 2023-10-29 VITALS — BP 147/78 | Ht 68.0 in | Wt 185.0 lb

## 2023-10-29 DIAGNOSIS — Q6672 Congenital pes cavus, left foot: Secondary | ICD-10-CM

## 2023-10-29 DIAGNOSIS — R269 Unspecified abnormalities of gait and mobility: Secondary | ICD-10-CM

## 2023-10-29 DIAGNOSIS — I10 Essential (primary) hypertension: Secondary | ICD-10-CM | POA: Diagnosis not present

## 2023-10-29 DIAGNOSIS — Q6671 Congenital pes cavus, right foot: Secondary | ICD-10-CM | POA: Diagnosis not present

## 2023-10-29 DIAGNOSIS — S86812A Strain of other muscle(s) and tendon(s) at lower leg level, left leg, initial encounter: Secondary | ICD-10-CM

## 2023-10-29 NOTE — Progress Notes (Signed)
 DATE OF VISIT: 10/29/2023    William Cook DOB: 15-May-1951 MRN: [id number]  CC:  Custom orthotics  History of present Illness: William Cook is a 73 y.o. male who presents for custom orthotics Previous orthotics made by us  06/07/20 Last seen by us  08/01/21 for unrelated issue He feels that current orthotics are starting to be worn out - These originally had lateral posting to help with excessive supination, he has needed to reinforce this more so on the left side He has had issues recently with recurrent left calf strains. - Had significant injury in December/January that required PT and about 9+ weeks of rest to improve - Tried to return to pickleball, but sustained a milder injury about 1 month ago to the left calf.  Needed to rest for approximately 3 days.  Did not have any significant swelling or bruising.  He is now only having some discomfort if he is standing or walking for extended periods of time.  He has not yet returned to pickleball.   Medications:  Outpatient Encounter Medications as of 10/29/2023  Medication Sig   acyclovir cream (ZOVIRAX) 5 % Apply topically.   amLODipine (NORVASC) 5 MG tablet Take 5 mg by mouth daily.   atorvastatin (LIPITOR) 10 MG tablet TAKE 1 TABLET BY MOUTH EVERYDAY AT BEDTIME   ELIQUIS 5 MG TABS tablet Take 5 mg by mouth 2 (two) times daily.   fluticasone (FLONASE) 50 MCG/ACT nasal spray Place into the nose.   Multiple Vitamins-Minerals (VITRUM SENIOR) TABS    nitroGLYCERIN (NITROSTAT) 0.4 MG SL tablet Place under the tongue.   No facility-administered encounter medications on file as of 10/29/2023.    Allergies: has no known allergies.  Physical Examination: Vitals: BP (!) 147/78   Ht 5\' 8"  (1.727 m)   Wt 185 lb (83.9 kg)   BMI 28.13 kg/m  GENERAL:  William Cook is a 73 y.o. male appearing their stated age, alert and oriented x 3, in no apparent distress.  SKIN: no rashes or lesions, skin clean, dry, intact MSK:  Lower leg: Bilateral calf  without any significant abnormalities.  With walking he does appear to have significant contraction and wobbling of the left calf compared to the right.  Able to perform heel raises without any issues Foot/ankle: Bilateral pes cavus.  Does have transverse arch collapse bilaterally without any significant splaying.  Mild hallux rigidus bilaterally.  Bilateral bunionette's.  No significant callus formation on feet bilaterally.  No bony tenderness. Gait: With standing has over supination bilaterally, left greater than right.  Increased dynamic supination with walking.  Neurovascular intact distally  Assessment & Plan Primary hypertension  1. Abnormality of gait 2. Pes cavus of both feet 3. Strain of left calf muscle 4. 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  Has recently had recurrent left calf strain, likely related to altered biomechanics, may be related to breakdown of his current orthotics  Plan: - He would benefit from updated custom orthotics.  These were fabricated as noted below - Recommend that he purchase calf sleeve to wear when he is playing pickle ball with other activity to help support the calf further - He is considering having a second pair of orthotics fabricated, he is aware of the cost of $190 out-of-pocket, he will schedule at his convenience  Patient was fitted for a : Fit 'n Run semi-rigid orthotic  The orthotic was heated, placed on the orthotic stand. The patient was positioned in subtalar neutral position and 10  degrees of ankle dorsiflexion in a weight bearing stance on the heated orthotic blank After completion of molding Blank: Fit 'n Run - size 10 Posting: Lateral posting bilaterally, double thickness on the left, signal thickness on the right Base: Built-in base - Fit and Run  Orthotics were comfortable in the office and he had a more neutral gait.  Supination was well-controlled with orthotics today.  He noted these felt  more comfortable than his old set.  -His blood pressure was noted to be elevated today.  He will continue follow-up with his PCP.  Will continue his current regimen.  Patient expressed understanding & agreement with above.  Encounter Diagnoses  Name Primary?   Abnormality of gait Yes   Pes cavus of both feet    Strain of left calf muscle    Primary hypertension     No orders of the defined types were placed in this encounter.

## 2023-10-29 NOTE — Patient Instructions (Signed)
 Thank you for coming to see me today. It was a pleasure.   We made you new orthotics. Let us know if we need to add any extra cushioning or make changes.  If you have any questions or concerns, please do not hesitate to call the office at [phone].

## 2024-02-07 ENCOUNTER — Other Ambulatory Visit: Payer: Medicare Other

## 2024-02-07 DIAGNOSIS — Z87898 Personal history of other specified conditions: Secondary | ICD-10-CM

## 2024-02-08 ENCOUNTER — Ambulatory Visit: Payer: Self-pay | Admitting: Urology

## 2024-02-08 LAB — PSA TOTAL (REFLEX TO FREE): Prostate Specific Ag, Serum: 3.6 ng/mL (ref 0.0–4.0)

## 2024-02-13 ENCOUNTER — Other Ambulatory Visit: Payer: Medicare Other

## 2024-02-13 ENCOUNTER — Ambulatory Visit (INDEPENDENT_AMBULATORY_CARE_PROVIDER_SITE_OTHER): Admitting: Urology

## 2024-02-13 DIAGNOSIS — Z8551 Personal history of malignant neoplasm of bladder: Secondary | ICD-10-CM | POA: Diagnosis not present

## 2024-02-13 MED ORDER — LIDOCAINE HCL URETHRAL/MUCOSAL 2 % EX GEL
1.0000 | Freq: Once | CUTANEOUS | Status: AC
  Administered 2024-02-13: 1 via URETHRAL

## 2024-02-13 NOTE — Progress Notes (Signed)
 Bladder cancer surveillance note   INDICATION History of bladder cancer   UROLOGIC HISTORY William Cook is a 73 y.o. male with history of a 3 cm lateral wall bladder tumor treated with TURBT in February 2022 with Dr. Nicholaus at [facility].  This was diagnosed on an ultrasound.   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  Did not undergo second look TURBT.   Treatments for Bladder Cancer 07/2020 TURBT 09/2020 induction BCG x6 01/2021: mBCG 06/2021: mBCG  He had a fair amount of urinary frequency, urgency and persistent urinary symptoms after his BCG from January 2023, and opted to defer further BCG.   Cystoscopy Procedure Note:   After informed consent and discussion of the procedure and its risks, Evon Lopezperez was positioned and prepped in the standard fashion. Cystoscopy was performed with the a flexible cystoscope. The urethra, bladder neck and entire bladder was visualized in a standard fashion.  Scar left lateral wall, no obvious recurrence. The ureteral orifices were visualized in their normal location and orientation.  No abnormalities on retroflexion.  Mild to moderate bladder trabeculations.  He also has a history of a PSA as high as 6.6, and this dropped down to 5.1 with 18% free, and repeat PSA recently remained stable at 5.0.  We discussed possible etiologies including BPH, prostate cancer, or more likely elevated secondary to multiple BCG treatments and cystoscopy, especially in the settings of his persistent irritative urinary symptoms after most recent BCG.  PSA continues to decrease, 4.4(19% free) on 03/07/2022.  Risks and benefits of PSA screening were discussed, as well as his reassuring PSA density of 0.07 with 60 g prostate on CT from January 2024.  Most recent PSA from August 2025 dropped all the way back down to normal at 3.6.  Reassurance provided.  He also has some obstructive urinary  symptoms, did not tolerate Flomax well in the past.  Not bothered enough to consider other medications like alfuzosin or finasteride or surgical intervention at this time.  We previously had discussed HOLEP, he may be interested this in the future  RTC 40mo cysto, space to yearly starting 07/2024   Redell Burnet, MD 02/13/2024

## 2024-02-14 ENCOUNTER — Other Ambulatory Visit: Payer: Medicare Other | Admitting: Urology

## 2024-08-20 ENCOUNTER — Other Ambulatory Visit: Admitting: Urology
